# Patient Record
Sex: Female | Born: 2000 | Race: Black or African American | Hispanic: No | Marital: Single | Smoking: Never smoker
Health system: Southern US, Community
[De-identification: ages and names within clinical notes are randomized; demographics above are authoritative.]

## PROBLEM LIST (undated history)

## (undated) ENCOUNTER — Inpatient Hospital Stay: Payer: Self-pay

## (undated) ENCOUNTER — Inpatient Hospital Stay (HOSPITAL_COMMUNITY): Payer: Self-pay

## (undated) DIAGNOSIS — I10 Essential (primary) hypertension: Secondary | ICD-10-CM

## (undated) DIAGNOSIS — R1110 Vomiting, unspecified: Secondary | ICD-10-CM

## (undated) DIAGNOSIS — J45909 Unspecified asthma, uncomplicated: Secondary | ICD-10-CM

## (undated) DIAGNOSIS — N189 Chronic kidney disease, unspecified: Secondary | ICD-10-CM

## (undated) DIAGNOSIS — R197 Diarrhea, unspecified: Secondary | ICD-10-CM

## (undated) DIAGNOSIS — A549 Gonococcal infection, unspecified: Secondary | ICD-10-CM

## (undated) DIAGNOSIS — A749 Chlamydial infection, unspecified: Secondary | ICD-10-CM

## (undated) DIAGNOSIS — Z9109 Other allergy status, other than to drugs and biological substances: Secondary | ICD-10-CM

## (undated) HISTORY — DX: Vomiting, unspecified: R11.10

## (undated) HISTORY — PX: MOUTH SURGERY: SHX715

## (undated) HISTORY — PX: NO PAST SURGERIES: SHX2092

## (undated) HISTORY — DX: Diarrhea, unspecified: R19.7

---
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
## 2018-01-22 ENCOUNTER — Emergency Department (HOSPITAL_COMMUNITY): Payer: Medicaid Other

## 2018-01-22 ENCOUNTER — Emergency Department (HOSPITAL_COMMUNITY)
Admission: EM | Admit: 2018-01-22 | Discharge: 2018-01-22 | Disposition: A | Discharge status: Home / Self Care | Payer: Medicaid Other | Attending: Emergency Medicine | Admitting: Emergency Medicine

## 2018-01-22 ENCOUNTER — Other Ambulatory Visit: Payer: Self-pay

## 2018-01-22 ENCOUNTER — Encounter (HOSPITAL_COMMUNITY): Payer: Self-pay | Admitting: Emergency Medicine

## 2018-01-22 DIAGNOSIS — Z3201 Encounter for pregnancy test, result positive: Secondary | ICD-10-CM | POA: Diagnosis not present

## 2018-01-22 DIAGNOSIS — O23599 Infection of other part of genital tract in pregnancy, unspecified trimester: Secondary | ICD-10-CM | POA: Diagnosis not present

## 2018-01-22 DIAGNOSIS — B373 Candidiasis of vulva and vagina: Secondary | ICD-10-CM

## 2018-01-22 DIAGNOSIS — R1011 Right upper quadrant pain: Secondary | ICD-10-CM | POA: Insufficient documentation

## 2018-01-22 DIAGNOSIS — O9989 Other specified diseases and conditions complicating pregnancy, childbirth and the puerperium: Secondary | ICD-10-CM | POA: Insufficient documentation

## 2018-01-22 DIAGNOSIS — O2341 Unspecified infection of urinary tract in pregnancy, first trimester: Secondary | ICD-10-CM | POA: Diagnosis not present

## 2018-01-22 DIAGNOSIS — N390 Urinary tract infection, site not specified: Secondary | ICD-10-CM

## 2018-01-22 DIAGNOSIS — N898 Other specified noninflammatory disorders of vagina: Secondary | ICD-10-CM

## 2018-01-22 DIAGNOSIS — O219 Vomiting of pregnancy, unspecified: Secondary | ICD-10-CM | POA: Diagnosis not present

## 2018-01-22 DIAGNOSIS — Z3A00 Weeks of gestation of pregnancy not specified: Secondary | ICD-10-CM | POA: Insufficient documentation

## 2018-01-22 DIAGNOSIS — B3731 Acute candidiasis of vulva and vagina: Secondary | ICD-10-CM

## 2018-01-22 LAB — COMPREHENSIVE METABOLIC PANEL
ALK PHOS: 55 U/L (ref 38–126)
ALT: 13 U/L (ref 0–44)
ANION GAP: 9 (ref 5–15)
AST: 18 U/L (ref 15–41)
Albumin: 4 g/dL (ref 3.5–5.0)
BILIRUBIN TOTAL: 0.9 mg/dL (ref 0.3–1.2)
BUN: 5 mg/dL — ABNORMAL LOW (ref 6–20)
CALCIUM: 9.2 mg/dL (ref 8.9–10.3)
CO2: 24 mmol/L (ref 22–32)
Chloride: 106 mmol/L (ref 98–111)
Creatinine, Ser: 0.74 mg/dL (ref 0.44–1.00)
GFR calc Af Amer: >60 mL/min (ref >60)
GFR calc non Af Amer: >60 mL/min (ref >60)
Glucose, Bld: 93 mg/dL (ref 70–99)
Potassium: 3.6 mmol/L (ref 3.5–5.1)
Sodium: 139 mmol/L (ref 135–145)
TOTAL PROTEIN: 7.1 g/dL (ref 6.5–8.1)

## 2018-01-22 LAB — URINALYSIS, ROUTINE W REFLEX MICROSCOPIC
Bilirubin Urine: NEGATIVE
GLUCOSE, UA: NEGATIVE mg/dL
Hgb urine dipstick: NEGATIVE
KETONES UR: NEGATIVE mg/dL
NITRITE: NEGATIVE
PH: 6 (ref 5.0–8.0)
Protein, ur: NEGATIVE mg/dL
Specific Gravity, Urine: 1.025 (ref 1.005–1.030)

## 2018-01-22 LAB — WET PREP, GENITAL
Clue Cells Wet Prep HPF POC: NONE SEEN
Trich, Wet Prep: NONE SEEN

## 2018-01-22 LAB — CBC
HCT: 41.4 % (ref 36.0–46.0)
HEMOGLOBIN: 13.6 g/dL (ref 12.0–15.0)
MCH: 30.1 pg (ref 26.0–34.0)
MCHC: 32.9 g/dL (ref 30.0–36.0)
MCV: 91.6 fL (ref 78.0–100.0)
Platelets: 302 10*3/uL (ref 150–400)
RBC: 4.52 MIL/uL (ref 3.87–5.11)
RDW: 12.2 % (ref 11.5–15.5)
WBC: 16.6 10*3/uL — AB (ref 4.0–10.5)

## 2018-01-22 LAB — I-STAT BETA HCG BLOOD, ED (MC, WL, AP ONLY): I-stat hCG, quantitative: >2000 m[IU]/mL — ABNORMAL HIGH (ref <5)

## 2018-01-22 LAB — LIPASE, BLOOD: Lipase: 21 U/L (ref 11–51)

## 2018-01-22 MED ORDER — STERILE WATER FOR INJECTION IJ SOLN
INTRAMUSCULAR | Status: AC
  Administered 2018-01-22: 0.9 mL
  Filled 2018-01-22: qty 10

## 2018-01-22 MED ORDER — ONDANSETRON 4 MG PO TBDP
4.0000 mg | ORAL_TABLET | Freq: Once | ORAL | Status: AC
  Administered 2018-01-22: 4 mg via ORAL
  Filled 2018-01-22: qty 1

## 2018-01-22 MED ORDER — ONDANSETRON HCL 4 MG PO TABS: 4.0000 mg | ORAL_TABLET | Freq: Three times a day (TID) | ORAL | 0 refills | Status: DC | PRN

## 2018-01-22 MED ORDER — CEFTRIAXONE SODIUM 250 MG IJ SOLR
250.0000 mg | Freq: Once | INTRAMUSCULAR | Status: AC
  Administered 2018-01-22: 250 mg via INTRAMUSCULAR
  Filled 2018-01-22: qty 250

## 2018-01-22 MED ORDER — AZITHROMYCIN 250 MG PO TABS
1000.0000 mg | ORAL_TABLET | Freq: Once | ORAL | Status: AC
Start: 2018-01-22 — End: 2018-01-22
  Administered 2018-01-22: 1000 mg via ORAL
  Filled 2018-01-22: qty 4

## 2018-01-22 MED ORDER — CLOTRIMAZOLE 1 % VA CREA: 1.0000 | TOPICAL_CREAM | Freq: Every day | VAGINAL | 0 refills | Status: AC

## 2018-01-22 MED ORDER — CEPHALEXIN 500 MG PO CAPS: 500.0000 mg | ORAL_CAPSULE | Freq: Two times a day (BID) | ORAL | 0 refills | Status: DC

## 2018-01-22 NOTE — ED Triage Notes (Signed)
Patient adds that she "would like to be tested for everything."

## 2018-01-22 NOTE — ED Provider Notes (Signed)
Patient placed in Quick Look pathway, seen and evaluated   Chief Complaint: abdominal pain  HPI:   Patient with 4 day history of epigastric and right upper quadrant abdominal pain which is aching/throbbing. Worsens after meals. She notes persistent nausea and vomiting and has not been able to keep any food down for a few days. Denies fevers, chills, urinary symptoms, diarrhea, or constipation. Also states "I want to be tested for everything. I just started messing around with a guy and I don't trust anyone". States she does not always use protection.  ROS: positive for abdominal pain, nausea, vomiting Negative for fever, chest pain, shortness of breath, diarrhea, constipation, urinary symptoms, vaginal itching, bleeding, discharge  Physical Exam:   Gen: No distress  Neuro: Awake and Alert  Skin: Warm    Focused Exam: hypoactive bowel sounds. Tender to palpation in the epigastric and right upper quadrant regions. Murphy sign absent, Rovsing's absent, no CVA tenderness.   Initiation of care has begun. The patient has been counseled on the process, plan, and necessity for staying for the completion/evaluation, and the remainder of the medical screening examination    Bennye AlmFawze, Raymona Boss A, PA-C 01/22/18 1604    Mesner, Barbara CowerJason, MD 01/22/18 2140

## 2018-01-22 NOTE — Discharge Instructions (Signed)
Take antibiotics as prescribed.  Take the entire course, even if your symptoms improve. Use vaginal cream once a day for the next 7 days. Take Zofran as needed for nausea or vomiting. It is very important that you follow-up with OB/GYN for further management of your pregnancy. Return to the emergency room if you develop fevers, persistent vomiting despite medication, or any new or concerning symptoms.  If your symptoms are related to the pregnancy, you may follow-up at the women's hospital/[facility]U for pregnancy related concerns.

## 2018-01-22 NOTE — ED Provider Notes (Signed)
[facility] EMERGENCY DEPARTMENT Provider Note   CSN: [id number] Arrival date & time: 01/22/18  1511     History   Chief Complaint Chief Complaint  Patient presents with  . Abdominal Pain    HPI Colleen Steele is a 17 y.o. female presenting for evaluation of nausea and vomiting.  Patient states over the past 2 days, she has been having persistent nausea and vomiting.  She states this is mostly postprandial.  She reports some mild intermittent upper abdominal cramping, but no significant pain.  Cramping is worse after eating.  She denies fevers, chills, chest pain, shortness of breath, abnormal urination, abnormal bowel movements.  She states she is sexually active with one female partner, they do not use protection.  She is not on birth control.  She denies vaginal discharge, but would like to be tested for STDs and "everything."  Patient reports a history of asthma, does not take any medications daily.  She has been trying to treat her symptoms with Alka-Seltzer, although this is not helped.  Last period was beginning of May, she is normally very regular.  This is abnormal for her.  She denies vaginal bleeding.  HPI  Past Medical History:  Diagnosis Date  . Asthma   . Diarrhea   . Environmental allergies   . Vomiting     Patient Active Problem List   Diagnosis Date Noted  . Periumbilical abdominal pain 11/28/2011  . Vomiting   . Diarrhea     History reviewed. No pertinent surgical history.   OB History   None      Home Medications    Prior to Admission medications   Medication Sig Start Date End Date Taking? Authorizing Provider  albuterol (PROVENTIL HFA;VENTOLIN HFA) 108 (90 Base) MCG/ACT inhaler Inhale 1-2 puffs into the lungs every 4 (four) hours as needed for wheezing or shortness of breath.   Yes [provider]  ibuprofen (ADVIL,MOTRIN) 600 MG tablet Take 600 mg by mouth every 4 (four) hours as needed for headache.   Yes [provider]  benzonatate (TESSALON) 100 MG capsule Take 2 capsules (200 mg total) by mouth 2 (two) times daily as needed for cough. Patient not taking: Reported on 01/22/2018 09/18/17   Roxy Horseman, PA-C  cephALEXin (KEFLEX) 500 MG capsule Take 1 capsule (500 mg total) by mouth 2 (two) times daily for 5 days. 01/22/18 01/27/18  Shawndrea Rutkowski, PA-C  clotrimazole (GYNE-LOTRIMIN) 1 % vaginal cream Place 1 Applicatorful vaginally at bedtime for 7 days. 01/22/18 01/29/18  Trueman Worlds, PA-C  ondansetron (ZOFRAN) 4 MG tablet Take 1 tablet (4 mg total) by mouth every 8 (eight) hours as needed for nausea or vomiting. 01/22/18   Macgregor Aeschliman, PA-C  predniSONE (DELTASONE) 10 MG tablet Take 5 tablets (50 mg total) by mouth daily. 50mg  po qday x 4 days qs Patient not taking: Reported on 09/18/2017 04/13/14   Charlynne Pander, MD    Family History Family History  Problem Relation Age of Onset  . Diverticulitis Sister     Social History Social History   Tobacco Use  . Smoking status: Never Smoker  . Smokeless tobacco: Never Used  Substance Use Topics  . Alcohol use: No  . Drug use: No     Allergies   Patient has no known allergies.   Review of Systems Review of Systems  Gastrointestinal: Positive for abdominal pain, nausea and vomiting.  All other systems reviewed and are negative.    Physical  Exam Updated Vital Signs BP 120/80 (BP Location: Left Arm)   Pulse 77   Temp 98.6 F (37 C) (Oral)   Resp 12   LMP 11/29/2017 (Approximate) Comment: states she missed June  SpO2 100%   Physical Exam  Constitutional: She is oriented to person, place, and time. She appears well-developed and well-nourished. No distress.  Appears in no distress, laughing and joking upon me entering the room.  HENT:  Head: Normocephalic and atraumatic.  Eyes: Pupils are equal, round, and reactive to light. Conjunctivae and EOM are normal.  Neck: Normal range of motion. Neck supple.    Cardiovascular: Normal rate, regular rhythm and intact distal pulses.  Pulmonary/Chest: Effort normal and breath sounds normal. No respiratory distress. She has no wheezes.  Abdominal: Soft. She exhibits no distension and no mass. There is tenderness. There is no rebound and no guarding.  Mild epigastric discomfort without rigidity, guarding, distention.  Negative Murphy's.  No pain at McBurney's.  No rebound.  Genitourinary: Rectum normal and vagina normal. Pelvic exam was performed with patient supine. There is no rash, tenderness or lesion on the right labia. There is no rash, tenderness or lesion on the left labia. Cervix exhibits discharge. Cervix exhibits no motion tenderness. Right adnexum displays no mass, no tenderness and no fullness. Left adnexum displays no mass, no tenderness and no fullness.  Genitourinary Comments: Chaperone present.  Thick yellowish discharge with mucus noted on exam.  No CMT or adnexal tenderness.  Musculoskeletal: Normal range of motion.  Neurological: She is alert and oriented to person, place, and time.  Skin: Skin is warm and dry. Capillary refill takes less than 2 seconds.  Psychiatric: She has a normal mood and affect.  Nursing note and vitals reviewed.    ED Treatments / Results  Labs (all labs ordered are listed, but only abnormal results are displayed) Labs Reviewed  WET PREP, GENITAL - Abnormal; Notable for the following components:      Result Value   Yeast Wet Prep HPF POC PRESENT (*)    WBC, Wet Prep HPF POC MANY (*)    All other components within normal limits  COMPREHENSIVE METABOLIC PANEL - Abnormal; Notable for the following components:   BUN 5 (*)    All other components within normal limits  CBC - Abnormal; Notable for the following components:   WBC 16.6 (*)    All other components within normal limits  URINALYSIS, ROUTINE W REFLEX MICROSCOPIC - Abnormal; Notable for the following components:   APPearance HAZY (*)    Leukocytes,  UA LARGE (*)    WBC, UA >50 (*)    Bacteria, UA FEW (*)    All other components within normal limits  I-STAT BETA HCG BLOOD, ED ([facility], [facility], [facility] ONLY) - Abnormal; Notable for the following components:   I-stat hCG, quantitative >2,000.0 (*)    All other components within normal limits  LIPASE, BLOOD  HIV ANTIBODY (ROUTINE TESTING)  RPR  GC/CHLAMYDIA PROBE AMP () NOT AT [facility]    EKG None  Radiology US Abdomen Limited Ruq  Result Date: 01/22/2018 CLINICAL DATA:  Right upper quadrant pain for the past 4 days. EXAM: ULTRASOUND ABDOMEN LIMITED RIGHT UPPER QUADRANT COMPARISON:  Abdominal ultrasound dated January 04, 2012. FINDINGS: Gallbladder: No gallstones or wall thickening visualized. No sonographic Murphy sign noted by sonographer. Common bile duct: Diameter: 3 mm, normal. Liver: No focal lesion identified. Within normal limits in parenchymal echogenicity. Portal vein is patent on color Doppler imaging  with normal direction of blood flow towards the liver. IMPRESSION: Normal right upper quadrant ultrasound. Electronically Signed   By: Obie DredgeWilliam T Derry M.D.   On: 01/22/2018 18:39    Procedures Procedures (including critical care time)  Medications Ordered in ED Medications  ondansetron (ZOFRAN-ODT) disintegrating tablet 4 mg (4 mg Oral Given 01/22/18 1949)  cefTRIAXone (ROCEPHIN) injection 250 mg (250 mg Intramuscular Given 01/22/18 2010)  azithromycin (ZITHROMAX) tablet 1,000 mg (1,000 mg Oral Given 01/22/18 2009)  sterile water (preservative free) injection (0.9 mLs  Given 01/22/18 2015)     Initial Impression / Assessment and Plan / ED Course  I have reviewed the triage vital signs and the nursing notes.  Pertinent labs & imaging results that were available during my care of the patient were reviewed by me and considered in my medical decision making (see chart for details).     Patient presenting for evaluation of nausea, vomiting, and upper abdominal cramping.  Physical exam  reassuring, patient is afebrile not tachycardic.  She appears nontoxic.  Labs show positive pregnancy, mild leukocytosis at 16.  Otherwise CBC and CMP reassuring.  UA with large leuks, few bacteria, greater than 50 white cells.  As she is pregnant, will treat for UTI.  Wet prep positive for yeast.  Discussed option of prophylactic treatment for STDs with patient, patient would like treatment today.  Rocephin and azithromycin given.  Will treat yeast infection with intravaginal cream, UTI with Keflex.  Zofran given in ER with symptom medic control, patient tolerating p.o. without difficulty at this time.  Will discharge with Zofran as needed.  Patient instructed to follow-up with OB/GYN for further management of her pregnancy.  At this time, patient appears safe for discharge.  Return precautions given.  Patient states she understands and agrees to plan.   Final Clinical Impressions(s) / ED Diagnoses   Final diagnoses:  RUQ pain  Nausea/vomiting in pregnancy  Yeast infection of the vagina  Vaginal discharge  Urinary tract infection without hematuria, site unspecified    ED Discharge Orders        Ordered    clotrimazole (GYNE-LOTRIMIN) 1 % vaginal cream  Daily at bedtime     01/22/18 2049    cephALEXin (KEFLEX) 500 MG capsule  2 times daily     01/22/18 2049    ondansetron (ZOFRAN) 4 MG tablet  Every 8 hours PRN     01/22/18 2049       Alveria ApleyCaccavale, Aloni Chuang, PA-C 01/22/18 2051    Bethann BerkshireZammit, Joseph, MD 01/25/18 [id number]

## 2018-01-22 NOTE — ED Triage Notes (Signed)
Patient to ED c/o abd pain, N/V x 3 days. States she missed her period last month, LMP beginning of May. Denies urinary symptoms, diarrhea, fevers/chills.

## 2018-01-23 ENCOUNTER — Ambulatory Visit (INDEPENDENT_AMBULATORY_CARE_PROVIDER_SITE_OTHER): Payer: Medicaid Other

## 2018-01-23 ENCOUNTER — Encounter: Payer: Self-pay | Admitting: Family

## 2018-01-23 ENCOUNTER — Other Ambulatory Visit: Payer: Self-pay

## 2018-01-23 VITALS — Wt 181.0 lb

## 2018-01-23 DIAGNOSIS — N912 Amenorrhea, unspecified: Secondary | ICD-10-CM | POA: Diagnosis present

## 2018-01-23 DIAGNOSIS — Z3201 Encounter for pregnancy test, result positive: Secondary | ICD-10-CM

## 2018-01-23 LAB — RPR: RPR: NONREACTIVE

## 2018-01-23 LAB — HIV ANTIBODY (ROUTINE TESTING W REFLEX): HIV SCREEN 4TH GENERATION: NONREACTIVE

## 2018-01-23 LAB — GC/CHLAMYDIA PROBE AMP (~~LOC~~) NOT AT ARMC
Chlamydia: POSITIVE — AB
Neisseria Gonorrhea: POSITIVE — AB

## 2018-01-23 LAB — POCT PREGNANCY, URINE: Preg Test, Ur: POSITIVE — AB

## 2018-01-23 NOTE — Progress Notes (Signed)
Patient here for pregnancy test.  Test is positive.  LMP 11/28/2017 EDD 09/07/2018.  Advised patient to seek prenatal care and start taking prenatal vitamins.

## 2018-01-25 ENCOUNTER — Other Ambulatory Visit: Payer: Self-pay

## 2018-01-25 ENCOUNTER — Encounter (HOSPITAL_COMMUNITY): Payer: Self-pay | Admitting: Emergency Medicine

## 2018-01-25 ENCOUNTER — Emergency Department (HOSPITAL_COMMUNITY)
Admission: EM | Admit: 2018-01-25 | Discharge: 2018-01-25 | Disposition: A | Discharge status: Home / Self Care | Payer: Medicaid Other | Attending: Emergency Medicine | Admitting: Emergency Medicine

## 2018-01-25 DIAGNOSIS — O98311 Other infections with a predominantly sexual mode of transmission complicating pregnancy, first trimester: Secondary | ICD-10-CM | POA: Diagnosis not present

## 2018-01-25 DIAGNOSIS — A64 Unspecified sexually transmitted disease: Secondary | ICD-10-CM

## 2018-01-25 DIAGNOSIS — O99511 Diseases of the respiratory system complicating pregnancy, first trimester: Secondary | ICD-10-CM | POA: Diagnosis not present

## 2018-01-25 DIAGNOSIS — A5611 Chlamydial female pelvic inflammatory disease: Secondary | ICD-10-CM | POA: Diagnosis not present

## 2018-01-25 DIAGNOSIS — J45909 Unspecified asthma, uncomplicated: Secondary | ICD-10-CM | POA: Insufficient documentation

## 2018-01-25 DIAGNOSIS — O219 Vomiting of pregnancy, unspecified: Secondary | ICD-10-CM | POA: Insufficient documentation

## 2018-01-25 DIAGNOSIS — Z3A08 8 weeks gestation of pregnancy: Secondary | ICD-10-CM | POA: Diagnosis not present

## 2018-01-25 MED ORDER — AZITHROMYCIN 250 MG PO TABS
1000.0000 mg | ORAL_TABLET | Freq: Once | ORAL | Status: AC
  Administered 2018-01-25: 1000 mg via ORAL
  Filled 2018-01-25: qty 4

## 2018-01-25 MED ORDER — ONDANSETRON 4 MG PO TBDP
8.0000 mg | ORAL_TABLET | Freq: Once | ORAL | Status: AC
  Administered 2018-01-25: 8 mg via ORAL
  Filled 2018-01-25: qty 2

## 2018-01-25 NOTE — ED Triage Notes (Signed)
Pt states she is [redacted] weeks pregnant, was treated here for STD recently and when checking mychart saw that her results were positive. Is concerned because she states she vomited up the po antibiotics when she took them here and wants to be sure she doesn't need more treatment.

## 2018-01-25 NOTE — ED Provider Notes (Signed)
[facility] EMERGENCY DEPARTMENT Provider Note   CSN: [id number] Arrival date & time: 01/25/18  2106     History   Chief Complaint Chief Complaint  Patient presents with  . Exposure to STD    HPI Colleen Steele is a 17 y.o. female.  17 year old female with history of asthma who presents for STD treatment.  The patient was evaluated here 3 days ago and given antibiotics for empiric treatment of STD.  She notes that she is [redacted] weeks pregnant.  She checked her chart online today and noted that her STD results were positive.  She became concerned because she vomited up 1 of the medications immediately after she received it in the ED 3 days ago.  She is here to make sure that she does not need treatment again.  She was given antibiotics for UTI and states that she has been compliant.  She denies any severe vomiting today, no fevers, no abdominal pain or other complaints.  The history is provided by the patient.  Exposure to STD     Past Medical History:  Diagnosis Date  . Asthma   . Diarrhea   . Environmental allergies   . Vomiting     Patient Active Problem List   Diagnosis Date Noted  . Periumbilical abdominal pain 11/28/2011  . Vomiting   . Diarrhea     History reviewed. No pertinent surgical history.   OB History   None      Home Medications    Prior to Admission medications   Medication Sig Start Date End Date Taking? Authorizing Provider  albuterol (PROVENTIL HFA;VENTOLIN HFA) 108 (90 Base) MCG/ACT inhaler Inhale 1-2 puffs into the lungs every 4 (four) hours as needed for wheezing or shortness of breath.    [provider]  clotrimazole (GYNE-LOTRIMIN) 1 % vaginal cream Place 1 Applicatorful vaginally at bedtime for 7 days. 01/22/18 01/29/18  Caccavale, Sophia, PA-C  ibuprofen (ADVIL,MOTRIN) 600 MG tablet Take 600 mg by mouth every 4 (four) hours as needed for headache.    [provider]  ondansetron (ZOFRAN) 4 MG tablet Take 1  tablet (4 mg total) by mouth every 8 (eight) hours as needed for nausea or vomiting. 01/22/18   Caccavale, Sophia, PA-C    Family History Family History  Problem Relation Age of Onset  . Diverticulitis Sister     Social History Social History   Tobacco Use  . Smoking status: Never Smoker  . Smokeless tobacco: Never Used  Substance Use Topics  . Alcohol use: No  . Drug use: No     Allergies   Patient has no known allergies.   Review of Systems Review of Systems All other systems reviewed and are negative except that which was mentioned in HPI   Physical Exam Updated Vital Signs BP (!) 131/110 (BP Location: Right Arm)   Pulse 91   Temp 98.6 F (37 C) (Oral)   Resp 20   SpO2 100%   Physical Exam  Constitutional: She is oriented to person, place, and time. She appears well-developed and well-nourished. No distress.  HENT:  Head: Normocephalic and atraumatic.  Mouth/Throat: Oropharynx is clear and moist.  Moist mucous membranes  Eyes: Conjunctivae are normal.  Neck: Neck supple.  Cardiovascular: Normal rate, regular rhythm and normal heart sounds.  No murmur heard. Pulmonary/Chest: Effort normal and breath sounds normal.  Abdominal: Soft. Bowel sounds are normal. She exhibits no distension. There is no tenderness.  Musculoskeletal: She exhibits no edema.  Neurological: She is alert and oriented to person, place, and time.  Fluent speech  Skin: Skin is warm and dry.  Psychiatric: She has a normal mood and affect. Judgment normal.  Nursing note and vitals reviewed.    ED Treatments / Results  Labs (all labs ordered are listed, but only abnormal results are displayed) Labs Reviewed - No data to display  EKG None  Radiology No results found.  Procedures Procedures (including critical care time)  Medications Ordered in ED Medications  ondansetron (ZOFRAN-ODT) disintegrating tablet 8 mg (8 mg Oral Given 01/25/18 2138)  azithromycin (ZITHROMAX) tablet  1,000 mg (1,000 mg Oral Given 01/25/18 2138)     Initial Impression / Assessment and Plan / ED Course  I have reviewed the triage vital signs and the nursing notes.     She was well-appearing and comfortable on exam.  I reviewed labs from a few days ago which show positive for GC and chlamydia.  She states that she immediately vomited the azithromycin that she had received.  Gave her a repeat dose of azithromycin here with Zofran.  Instructed to continue prescriptions at home and schedule an appointment with OB/GYN for follow-up.  Extensively reviewed return precautions and she voiced understanding. Final Clinical Impressions(s) / ED Diagnoses   Final diagnoses:  STD (female)    ED Discharge Orders    None       Little, Ambrose Finlandachel Morgan, MD 01/25/18 2149

## 2018-01-25 NOTE — ED Notes (Signed)
Patient Alert and oriented to baseline. Stable and ambulatory to baseline. Patient verbalized understanding of the discharge instructions.  Patient belongings were taken by the patient.   

## 2018-01-25 NOTE — Discharge Instructions (Addendum)
KEEP TAKING PRESCRIPTION MEDICINES AS PRESCRIBED UNTIL ANTIBIOTICS ARE FINISHED. FOLLOW UP WITH OBGYN. RETURN TO ER IF ANY FEVERS, ABDOMINAL PAIN, VAGINAL BLEEDING, OR SEVERE VOMITING.

## 2018-02-18 ENCOUNTER — Encounter (HOSPITAL_COMMUNITY): Payer: Self-pay | Admitting: Emergency Medicine

## 2018-02-18 ENCOUNTER — Emergency Department (HOSPITAL_COMMUNITY)
Admission: EM | Admit: 2018-02-18 | Discharge: 2018-02-19 | Disposition: A | Discharge status: Home / Self Care | Payer: Medicaid Other | Attending: Emergency Medicine | Admitting: Emergency Medicine

## 2018-02-18 ENCOUNTER — Other Ambulatory Visit: Payer: Self-pay

## 2018-02-18 DIAGNOSIS — J45909 Unspecified asthma, uncomplicated: Secondary | ICD-10-CM | POA: Insufficient documentation

## 2018-02-18 DIAGNOSIS — E860 Dehydration: Secondary | ICD-10-CM | POA: Diagnosis not present

## 2018-02-18 DIAGNOSIS — N390 Urinary tract infection, site not specified: Secondary | ICD-10-CM | POA: Insufficient documentation

## 2018-02-18 DIAGNOSIS — Z3A12 12 weeks gestation of pregnancy: Secondary | ICD-10-CM | POA: Insufficient documentation

## 2018-02-18 DIAGNOSIS — Z79899 Other long term (current) drug therapy: Secondary | ICD-10-CM | POA: Insufficient documentation

## 2018-02-18 DIAGNOSIS — R8281 Pyuria: Secondary | ICD-10-CM

## 2018-02-18 DIAGNOSIS — O219 Vomiting of pregnancy, unspecified: Secondary | ICD-10-CM | POA: Diagnosis present

## 2018-02-18 LAB — CBC WITH DIFFERENTIAL/PLATELET
ABS IMMATURE GRANULOCYTES: 0 10*3/uL (ref 0.0–0.1)
Basophils Absolute: 0 10*3/uL (ref 0.0–0.1)
Basophils Relative: 0 %
Eosinophils Absolute: 0 10*3/uL (ref 0.0–0.7)
Eosinophils Relative: 0 %
HCT: 44.1 % (ref 36.0–46.0)
HEMOGLOBIN: 14.8 g/dL (ref 12.0–15.0)
Immature Granulocytes: 0 %
Lymphocytes Relative: 11 %
Lymphs Abs: 1.7 10*3/uL (ref 0.7–4.0)
MCH: 30.2 pg (ref 26.0–34.0)
MCHC: 33.6 g/dL (ref 30.0–36.0)
MCV: 90 fL (ref 78.0–100.0)
MONOS PCT: 7 %
Monocytes Absolute: 1.1 10*3/uL — ABNORMAL HIGH (ref 0.1–1.0)
Neutro Abs: 12.3 10*3/uL — ABNORMAL HIGH (ref 1.7–7.7)
Neutrophils Relative %: 82 %
PLATELETS: 313 10*3/uL (ref 150–400)
RBC: 4.9 MIL/uL (ref 3.87–5.11)
RDW: 12.1 % (ref 11.5–15.5)
WBC: 15.1 10*3/uL — ABNORMAL HIGH (ref 4.0–10.5)

## 2018-02-18 LAB — BASIC METABOLIC PANEL
ANION GAP: 11 (ref 5–15)
BUN: 7 mg/dL (ref 6–20)
CALCIUM: 9.6 mg/dL (ref 8.9–10.3)
CO2: 23 mmol/L (ref 22–32)
Chloride: 104 mmol/L (ref 98–111)
Creatinine, Ser: 0.77 mg/dL (ref 0.44–1.00)
GFR calc Af Amer: >60 mL/min (ref >60)
GLUCOSE: 87 mg/dL (ref 70–99)
Potassium: 3.6 mmol/L (ref 3.5–5.1)
Sodium: 138 mmol/L (ref 135–145)

## 2018-02-18 MED ORDER — ONDANSETRON HCL 4 MG PO TABS: 4.0000 mg | ORAL_TABLET | Freq: Three times a day (TID) | ORAL | 0 refills | Status: DC | PRN

## 2018-02-18 MED ORDER — SODIUM CHLORIDE 0.9 % IV BOLUS
1000.0000 mL | Freq: Once | INTRAVENOUS | Status: AC
  Administered 2018-02-18: 1000 mL via INTRAVENOUS

## 2018-02-18 MED ORDER — ONDANSETRON HCL 4 MG/2ML IJ SOLN
4.0000 mg | Freq: Once | INTRAMUSCULAR | Status: AC
  Administered 2018-02-18: 4 mg via INTRAVENOUS
  Filled 2018-02-18: qty 2

## 2018-02-18 NOTE — ED Notes (Signed)
Attempted fetal heart tones; unable to fully assess.

## 2018-02-18 NOTE — ED Triage Notes (Signed)
Pt states she is approx [redacted] weeks pregnant.  Reports nausea and vomiting since being seen in ED last.  States she ran out of Zofran that she was given in ED and her PCP gave her Unisom but it doesn't work.  Also reports dizziness for past 2 days and hasn't ate in 2 days.

## 2018-02-18 NOTE — ED Provider Notes (Signed)
[facility] EMERGENCY DEPARTMENT Provider Note   CSN: [id number] Arrival date & time: 02/18/18  2058    History   Chief Complaint Chief Complaint  Patient presents with  . Nausea  . Dizziness    HPI Colleen Steele is a 17 y.o. female.  17 y/o G1P0 female, approximately [redacted] weeks pregnant, presents to the emergency department for evaluation of nausea and vomiting.  She has had worsening nausea and vomiting over the past 4 days.  Symptoms associated with lightheadedness during episodes of emesis.  No associated syncope.  She previously had symptomatic improvement with Zofran.  She ran out of this prescription a few weeks ago and was told to take Unisom, but is unable to tolerate it without vomiting.  She denies any fevers, abdominal pain, urinary symptoms, vaginal bleeding, vaginal discharge.  She has had ongoing issues with hyperemesis during her first trimester of pregnancy.  The history is provided by the patient. No language interpreter was used.  Dizziness    Past Medical History:  Diagnosis Date  . Asthma   . Diarrhea   . Environmental allergies   . Vomiting     Patient Active Problem List   Diagnosis Date Noted  . Periumbilical abdominal pain 11/28/2011  . Vomiting   . Diarrhea     History reviewed. No pertinent surgical history.   OB History    Gravida  1   Para      Term      Preterm      AB      Living        SAB      TAB      Ectopic      Multiple      Live Births               Home Medications    Prior to Admission medications   Medication Sig Start Date End Date Taking? Authorizing Provider  albuterol (PROVENTIL HFA;VENTOLIN HFA) 108 (90 Base) MCG/ACT inhaler Inhale 1-2 puffs into the lungs every 4 (four) hours as needed for wheezing or shortness of breath.   Yes [provider]  doxylamine, Sleep, (UNISOM) 25 MG tablet Take 25 mg by mouth at bedtime as needed for sleep.   Yes [provider]   ibuprofen (ADVIL,MOTRIN) 600 MG tablet Take 600 mg by mouth every 4 (four) hours as needed for headache.   Yes [provider]  Prenatal Vit-Fe Fumarate-FA (MULTIVITAMIN-PRENATAL) 27-0.8 MG TABS tablet Take 1 tablet by mouth daily at 12 noon.   Yes [provider]  ondansetron (ZOFRAN) 4 MG tablet Take 1 tablet (4 mg total) by mouth every 8 (eight) hours as needed for nausea or vomiting. 02/18/18   Antony Madura, PA-C    Family History Family History  Problem Relation Age of Onset  . Diverticulitis Sister     Social History Social History   Tobacco Use  . Smoking status: Never Smoker  . Smokeless tobacco: Never Used  Substance Use Topics  . Alcohol use: No  . Drug use: No     Allergies   Patient has no known allergies.   Review of Systems Review of Systems  Neurological: Positive for dizziness.  Ten systems reviewed and are negative for acute change, except as noted in the HPI.    Physical Exam Updated Vital Signs BP 130/83 (BP Location: Left Arm)   Pulse 80   Temp 98.1 F (36.7 C) (Oral)   Resp 19  LMP 11/29/2017 (Approximate) Comment: states she missed June  SpO2 100%   Physical Exam  Constitutional: She is oriented to person, place, and time. She appears well-developed and well-nourished. No distress.  Nontoxic appearing and in NAD  HENT:  Head: Normocephalic and atraumatic.  Eyes: Conjunctivae and EOM are normal. No scleral icterus.  Neck: Normal range of motion.  Cardiovascular: Normal rate, regular rhythm and intact distal pulses.  Pulmonary/Chest: Effort normal. No respiratory distress.  Abdominal: Soft. There is no tenderness.  Soft, nontender abdomen. No peritoneal signs.  Musculoskeletal: Normal range of motion.  Neurological: She is alert and oriented to person, place, and time. She exhibits normal muscle tone. Coordination normal.  Skin: Skin is warm and dry. No rash noted. She is not diaphoretic. No erythema. No pallor.    Psychiatric: She has a normal mood and affect. Her behavior is normal.  Nursing note and vitals reviewed.    ED Treatments / Results  Labs (all labs ordered are listed, but only abnormal results are displayed) Labs Reviewed  CBC WITH DIFFERENTIAL/PLATELET - Abnormal; Notable for the following components:      Result Value   WBC 15.1 (*)    Neutro Abs 12.3 (*)    Monocytes Absolute 1.1 (*)    All other components within normal limits  URINALYSIS, ROUTINE W REFLEX MICROSCOPIC - Abnormal; Notable for the following components:   Color, Urine AMBER (*)    APPearance HAZY (*)    Glucose, UA 50 (*)    Ketones, ur 80 (*)    Leukocytes, UA MODERATE (*)    WBC, UA >50 (*)    Bacteria, UA RARE (*)    All other components within normal limits  BASIC METABOLIC PANEL    EKG None  Radiology No results found.    EMERGENCY DEPARTMENT [place] PREGNANCY "Study: Limited Ultrasound of the Pelvis for Pregnancy"  INDICATIONS:Pregnancy(required) and nausea and vomiting Multiple views of the uterus and pelvic cavity were obtained in real-time with a multi-frequency probe.  APPROACH:Transabdominal  PERFORMED BY: Myself IMAGES ARCHIVED?: Yes LIMITATIONS: Body habitus and Emergent procedure PREGNANCY FREE FLUID: None ADNEXAL FINDINGS: not assessed GESTATIONAL AGE, ESTIMATE: 12 weeks FETAL HEART RATE: WNL INTERPRETATION: Fetal heart activity seen   Procedures Procedures (including critical care time)  Medications Ordered in ED Medications  sodium chloride 0.9 % bolus 1,000 mL (0 mLs Intravenous Stopped 02/18/18 2322)  ondansetron (ZOFRAN) injection 4 mg (4 mg Intravenous Given 02/18/18 2207)  sodium chloride 0.9 % bolus 1,000 mL (0 mLs Intravenous Stopped 02/19/18 0024)    6:30 AM Notified patient by phone that her urine was concerning for UTI.  Verbalized that prescription will be called into her pharmacy for pickup.  She requests that the prescription be sent to [facility] on [address].  Message was left with [facility] regarding new prescription.   Initial Impression / Assessment and Plan / ED Course  I have reviewed the triage vital signs and the nursing notes.  Pertinent labs & imaging results that were available during my care of the patient were reviewed by me and considered in my medical decision making (see chart for details).     17 year old female presenting for hyperemesis in pregnancy.  This was previously well controlled with Zofran, but she has been out of this prescription for a few weeks.  She has tried taking Unisom without relief of her nausea.  She reports inability to keep down fluids for the past 2 days.  Patient has felt improved after  receiving 2 L IV fluids.  She is able to tolerate ginger ale without additional vomiting.  Laboratory work-up notable for leukocytosis, though this may be secondary to the stress of frequent emesis as well as dehydration.  It is also not uncommon for leukocytosis to be present in the setting of pregnancy.  No electrolyte derangements noted.  Kidney function preserved.  She does have evidence of pyuria concerning for urinary tract infection.  Will start on Keflex.  The patient has been advised to follow-up with her OB/GYN.  Her next appointment is scheduled for August 8.  Return precautions discussed and provided. Patient discharged in stable condition with no unaddressed concerns.   Final Clinical Impressions(s) / ED Diagnoses   Final diagnoses:  Dehydration  Pyuria    ED Discharge Orders        Ordered    ondansetron (ZOFRAN) 4 MG tablet  Every 8 hours PRN     02/18/18 2354       Antony MaduraHumes, Marene Gilliam, PA-C 02/19/18 [id number]    Mesner, Barbara CowerJason, MD 02/21/18 [phone]

## 2018-02-18 NOTE — ED Notes (Signed)
Patient given saltine crackers and PO fluids. Tolerating well at this time.

## 2018-02-19 LAB — URINALYSIS, ROUTINE W REFLEX MICROSCOPIC
BILIRUBIN URINE: NEGATIVE
Glucose, UA: 50 mg/dL — AB
Hgb urine dipstick: NEGATIVE
KETONES UR: 80 mg/dL — AB
Nitrite: NEGATIVE
PROTEIN: NEGATIVE mg/dL
Specific Gravity, Urine: 1.026 (ref 1.005–1.030)
WBC, UA: >50 WBC/hpf — ABNORMAL HIGH (ref 0–5)
pH: 5 (ref 5.0–8.0)

## 2018-02-19 NOTE — ED Notes (Signed)
Patient requests to be discharged. States she does not want to wait for UA results.   PA Tresa EndoKelly made aware. States if patient's phone number is correct in chart she can be d/c home.

## 2018-02-19 NOTE — ED Notes (Addendum)
Patient verbalizes understanding of medications and discharge instructions. No further questions at this time. VSS and patient ambulatory at discharge.   

## 2018-02-19 NOTE — ED Notes (Signed)
Patient verbalizes understanding of medications and discharge instructions. No further questions at this time. VSS and patient ambulatory at discharge.   

## 2018-03-01 ENCOUNTER — Other Ambulatory Visit: Payer: Self-pay

## 2018-03-01 ENCOUNTER — Encounter: Payer: Self-pay | Admitting: General Practice

## 2018-03-01 ENCOUNTER — Encounter: Payer: Self-pay | Admitting: Obstetrics and Gynecology

## 2018-03-01 ENCOUNTER — Ambulatory Visit (INDEPENDENT_AMBULATORY_CARE_PROVIDER_SITE_OTHER): Payer: Medicaid Other | Admitting: Obstetrics and Gynecology

## 2018-03-01 ENCOUNTER — Other Ambulatory Visit (HOSPITAL_COMMUNITY)
Admission: RE | Admit: 2018-03-01 | Discharge: 2018-03-01 | Disposition: A | Discharge status: Home / Self Care | Payer: Medicaid Other | Source: Ambulatory Visit | Attending: Obstetrics and Gynecology | Admitting: Obstetrics and Gynecology

## 2018-03-01 DIAGNOSIS — Z3481 Encounter for supervision of other normal pregnancy, first trimester: Secondary | ICD-10-CM

## 2018-03-01 DIAGNOSIS — O0993 Supervision of high risk pregnancy, unspecified, third trimester: Secondary | ICD-10-CM | POA: Insufficient documentation

## 2018-03-01 DIAGNOSIS — O219 Vomiting of pregnancy, unspecified: Secondary | ICD-10-CM | POA: Diagnosis not present

## 2018-03-01 DIAGNOSIS — Z3A13 13 weeks gestation of pregnancy: Secondary | ICD-10-CM | POA: Insufficient documentation

## 2018-03-01 DIAGNOSIS — Z3400 Encounter for supervision of normal first pregnancy, unspecified trimester: Secondary | ICD-10-CM

## 2018-03-01 LAB — POCT URINALYSIS DIPSTICK OB
Glucose, UA: NEGATIVE — AB
Nitrite, UA: NEGATIVE
ODOR: NEGATIVE
PH UA: 6 (ref 5.0–8.0)
RBC UA: NEGATIVE
Spec Grav, UA: >=1.03 — AB (ref 1.010–1.025)
UROBILINOGEN UA: 1 U/dL

## 2018-03-01 MED ORDER — ONDANSETRON HCL 4 MG PO TABS: 4.0000 mg | ORAL_TABLET | Freq: Three times a day (TID) | ORAL | 0 refills | Status: DC | PRN

## 2018-03-01 MED ORDER — PRENATAL GUMMIES/DHA & FA 0.4-32.5 MG PO CHEW: 1.0000 | CHEWABLE_TABLET | Freq: Every day | ORAL | 12 refills | Status: DC

## 2018-03-01 NOTE — Patient Instructions (Addendum)
Second Trimester of Pregnancy The second trimester is from week 13 through week 28, month 4 through 6. This is often the time in pregnancy that you feel your best. Often times, morning sickness has lessened or quit. You may have more energy, and you may get hungry more often. Your unborn baby (fetus) is growing rapidly. At the end of the sixth month, he or she is about 9 inches long and weighs about 1 pounds. You will likely feel the baby move (quickening) between 18 and 20 weeks of pregnancy. Follow these instructions at home:  Avoid all smoking, herbs, and alcohol. Avoid drugs not approved by your doctor.  Do not use any tobacco products, including cigarettes, chewing tobacco, and electronic cigarettes. If you need help quitting, ask your doctor. You may get counseling or other support to help you quit.  Only take medicine as told by your doctor. Some medicines are safe and some are not during pregnancy.  Exercise only as told by your doctor. Stop exercising if you start having cramps.  Eat regular, healthy meals.  Wear a good support bra if your breasts are tender.  Do not use hot tubs, steam rooms, or saunas.  Wear your seat belt when driving.  Avoid raw meat, uncooked cheese, and liter boxes and soil used by cats.  Take your prenatal vitamins.  Take 1500-2000 milligrams of calcium daily starting at the 20th week of pregnancy until you deliver your baby.  Try taking medicine that helps you poop (stool softener) as needed, and if your doctor approves. Eat more fiber by eating fresh fruit, vegetables, and whole grains. Drink enough fluids to keep your pee (urine) clear or pale yellow.  Take warm water baths (sitz baths) to soothe pain or discomfort caused by hemorrhoids. Use hemorrhoid cream if your doctor approves.  If you have puffy, bulging veins (varicose veins), wear support hose. Raise (elevate) your feet for 15 minutes, 3-4 times a day. Limit salt in your diet.  Avoid heavy  lifting, wear low heals, and sit up straight.  Rest with your legs raised if you have leg cramps or low back pain.  Visit your dentist if you have not gone during your pregnancy. Use a soft toothbrush to brush your teeth. Be gentle when you floss.  You can have sex (intercourse) unless your doctor tells you not to.  Go to your doctor visits. Get help if:  You feel dizzy.  You have mild cramps or pressure in your lower belly (abdomen).  You have a nagging pain in your belly area.  You continue to feel sick to your stomach (nauseous), throw up (vomit), or have watery poop (diarrhea).  You have bad smelling fluid coming from your vagina.  You have pain with peeing (urination). Get help right away if:  You have a fever.  You are leaking fluid from your vagina.  You have spotting or bleeding from your vagina.  You have severe belly cramping or pain.  You lose or gain weight rapidly.  You have trouble catching your breath and have chest pain.  You notice sudden or extreme puffiness (swelling) of your face, hands, ankles, feet, or legs.  You have not felt the baby move in over an hour.  You have severe headaches that do not go away with medicine.  You have vision changes. This information is not intended to replace advice given to you by your health care provider. Make sure you discuss any questions you have with your health care   provider. Document Released: 10/05/2009 Document Revised: 12/17/2015 Document Reviewed: 09/11/2012 Elsevier Interactive Patient Education  2017 [employer]  Morning Sickness Morning sickness is when you feel sick to your stomach (nauseous) during pregnancy. This nauseous feeling may or may not come with vomiting. It often occurs in the morning but can be a problem any time of day. Morning sickness is most common during the first trimester, but it may continue throughout pregnancy. While morning sickness is unpleasant, it is usually harmless unless  you develop severe and continual vomiting (hyperemesis gravidarum). This condition requires more intense treatment. What are the causes? The cause of morning sickness is not completely known but seems to be related to normal hormonal changes that occur in pregnancy. What increases the risk? You are at greater risk if you:  Experienced nausea or vomiting before your pregnancy.  Had morning sickness during a previous pregnancy.  Are pregnant with more than one baby, such as twins.  How is this treated? Do not use any medicines (prescription, over-the-counter, or herbal) for morning sickness without first talking to your health care provider. Your health care provider may prescribe or recommend:  Vitamin B6 supplements.  Anti-nausea medicines.  The herbal medicine ginger.  Follow these instructions at home:  Only take over-the-counter or prescription medicines as directed by your health care provider.  Taking multivitamins before getting pregnant can prevent or decrease the severity of morning sickness in most women.  Eat a piece of dry toast or unsalted crackers before getting out of bed in the morning.  Eat five or six small meals a day.  Eat dry and bland foods (rice, baked potato). Foods high in carbohydrates are often helpful.  Do not drink liquids with your meals. Drink liquids between meals.  Avoid greasy, fatty, and spicy foods.  Get someone to cook for you if the smell of any food causes nausea and vomiting.  If you feel nauseous after taking prenatal vitamins, take the vitamins at night or with a snack.  Snack on protein foods (nuts, yogurt, cheese) between meals if you are hungry.  Eat unsweetened gelatins for desserts.  Wearing an acupressure wristband (worn for sea sickness) may be helpful.  Acupuncture may be helpful.  Do not smoke.  Get a humidifier to keep the air in your house free of odors.  Get plenty of fresh air. Contact a health care provider  if:  Your home remedies are not working, and you need medicine.  You feel dizzy or lightheaded.  You are losing weight. Get help right away if:  You have persistent and uncontrolled nausea and vomiting.  You pass out (faint). This information is not intended to replace advice given to you by your health care provider. Make sure you discuss any questions you have with your health care provider. Document Released: 09/01/2006 Document Revised: 12/17/2015 Document Reviewed: 12/26/2012 Elsevier Interactive Patient Education  2017 Elsevier Inc.  

## 2018-03-01 NOTE — Progress Notes (Signed)
  Subjective:    Colleen Steele is being seen today for her first obstetrical visit.  This is a planned pregnancy. She is at 391w1d gestation. Her obstetrical history is significant for teen pregnancy, h/o MJ use and h/o miscarriage. Relationship with FOB (Colleen Steele): significant other, not living together. Patient does intend to breast feed. Pregnancy history fully reviewed.  Patient reports nausea and vomiting.  Review of Systems:   Review of Systems  Constitutional: Negative.   HENT: Negative.   Eyes: Negative.   Respiratory: Negative.   Cardiovascular: Negative.   Gastrointestinal: Positive for nausea and vomiting (well-managed with Zofran).  Endocrine: Negative.   Genitourinary: Negative.   Musculoskeletal: Negative.   Skin: Negative.   Allergic/Immunologic: Negative.   Neurological: Negative.   Hematological: Negative.   Psychiatric/Behavioral: Negative.     Objective:     BP (!) 141/84   Pulse 91   Temp 98.2 F (36.8 C)   Wt 165 lb (74.8 kg)   LMP 11/29/2017 (Approximate) Comment: states she missed June Physical Exam  Nursing note and vitals reviewed. Constitutional: She is oriented to person, place, and time. She appears well-developed and well-nourished.  HENT:  Head: Normocephalic and atraumatic.  Right Ear: External ear normal.  Left Ear: External ear normal.  Nose: Nose normal.  Mouth/Throat: Oropharynx is clear and moist.  Eyes: Pupils are equal, round, and reactive to light. Conjunctivae and EOM are normal.  Neck: Normal range of motion. Neck supple.  Cardiovascular: Normal rate, regular rhythm, normal heart sounds and intact distal pulses.  Respiratory: Effort normal and breath sounds normal.  GI: Soft. Bowel sounds are normal.  Genitourinary: Vagina normal and uterus normal.  Genitourinary Comments: Uterus: enlarged, S=D, SE: cervix is smooth, pink, no lesions, small amt of thick, white vaginal d/c -- WP, GC/CT done, closed/long/firm, no CMT or  friability, no adnexal tenderness   Musculoskeletal: Normal range of motion.  Neurological: She is alert and oriented to person, place, and time. She has normal reflexes.  Skin: Skin is warm and dry.  Psychiatric: She has a normal mood and affect. Her behavior is normal. Judgment and thought content normal.    Maternal Exam:  Abdomen: Patient reports no abdominal tenderness. Introitus: Normal vulva. Normal vagina.  Ferning test: not done.  Nitrazine test: not done. Amniotic fluid character: not assessed.  Pelvis: adequate for delivery.   Cervix: Cervix evaluated by sterile speculum exam and digital exam.     Fetal Exam Fetal Monitor Review: Mode: hand-held doppler probe.   Baseline rate: 145 bpm.      Assessment:    Pregnancy: G2P0010 Patient Active Problem List   Diagnosis Date Noted  . Supervision of normal first pregnancy, antepartum 03/01/2018  . Vomiting        Plan:     Initial labs drawn. Panorama drawn. Prenatal vitamins. Zofran and PNV Rx's renewed. Problem list reviewed and updated. AFP3 discussed: requested. Role of ultrasound in pregnancy discussed; fetal survey: ordered. Amniocentesis discussed: not indicated. Follow up in 4 weeks. 100% of 40 min visit spent on counseling and coordination of care.    Raelyn Moraolitta Mailin Coglianese, MSN, CNM 03/01/2018

## 2018-03-01 NOTE — Addendum Note (Signed)
Addended by: Areta HaberMOREHEAD, Ladaisha Portillo B on: 03/01/2018 04:47 PM   Modules accepted: Orders

## 2018-03-02 ENCOUNTER — Encounter: Payer: Self-pay | Admitting: General Practice

## 2018-03-02 LAB — CERVICOVAGINAL ANCILLARY ONLY
Chlamydia: NEGATIVE
Neisseria Gonorrhea: NEGATIVE

## 2018-03-04 LAB — CULTURE, OB URINE

## 2018-03-04 LAB — URINE CULTURE, OB REFLEX

## 2018-03-09 LAB — SMN1 COPY NUMBER ANALYSIS (SMA CARRIER SCREENING)

## 2018-03-12 ENCOUNTER — Encounter: Payer: Self-pay | Admitting: General Practice

## 2018-03-12 LAB — OBSTETRIC PANEL, INCLUDING HIV
ANTIBODY SCREEN: NEGATIVE
Basophils Absolute: 0 10*3/uL (ref 0.0–0.2)
Basos: 0 %
EOS (ABSOLUTE): 0 10*3/uL (ref 0.0–0.4)
EOS: 0 %
HEMOGLOBIN: 13.8 g/dL (ref 11.1–15.9)
HIV Screen 4th Generation wRfx: NONREACTIVE
Hematocrit: 42.3 % (ref 34.0–46.6)
Hepatitis B Surface Ag: NEGATIVE
IMMATURE GRANULOCYTES: 0 %
Immature Grans (Abs): 0 10*3/uL (ref 0.0–0.1)
LYMPHS ABS: 2 10*3/uL (ref 0.7–3.1)
Lymphs: 14 %
MCH: 30.1 pg (ref 26.6–33.0)
MCHC: 32.6 g/dL (ref 31.5–35.7)
MCV: 92 fL (ref 79–97)
MONOS ABS: 1.1 10*3/uL — AB (ref 0.1–0.9)
Monocytes: 8 %
NEUTROS PCT: 78 %
Neutrophils Absolute: 11.3 10*3/uL — ABNORMAL HIGH (ref 1.4–7.0)
Platelets: 298 10*3/uL (ref 150–450)
RBC: 4.58 x10E6/uL (ref 3.77–5.28)
RDW: 13.5 % (ref 12.3–15.4)
RH TYPE: POSITIVE
RPR Ser Ql: NONREACTIVE
Rubella Antibodies, IGG: 8.19 index (ref >0.99)
WBC: 14.4 10*3/uL — AB (ref 3.4–10.8)

## 2018-03-12 LAB — HEMOGLOBINOPATHY EVALUATION
FERRITIN: 52 ng/mL (ref 15–77)
HGB A2 QUANT: 2.1 % (ref 1.8–3.2)
HGB F QUANT: 0 % (ref 0.0–2.0)
HGB SOLUBILITY: NEGATIVE
HGB VARIANT: 0 %
Hgb A: 97.9 % (ref 96.4–98.8)
Hgb C: 0 %
Hgb S: 0 %

## 2018-03-12 LAB — CYSTIC FIBROSIS MUTATION 97: GENE DIS ANAL CARRIER INTERP BLD/T-IMP: NOT DETECTED

## 2018-03-14 ENCOUNTER — Encounter: Payer: Self-pay | Admitting: General Practice

## 2018-03-29 ENCOUNTER — Ambulatory Visit (INDEPENDENT_AMBULATORY_CARE_PROVIDER_SITE_OTHER): Payer: Medicaid Other | Admitting: Obstetrics and Gynecology

## 2018-03-29 VITALS — BP 142/83 | HR 80 | Wt 161.0 lb

## 2018-03-29 DIAGNOSIS — O169 Unspecified maternal hypertension, unspecified trimester: Secondary | ICD-10-CM

## 2018-03-29 DIAGNOSIS — O162 Unspecified maternal hypertension, second trimester: Secondary | ICD-10-CM

## 2018-03-29 DIAGNOSIS — Z3400 Encounter for supervision of normal first pregnancy, unspecified trimester: Secondary | ICD-10-CM

## 2018-03-29 LAB — POCT URINALYSIS DIPSTICK OB
Bilirubin, UA: NEGATIVE
GLUCOSE, UA: NEGATIVE
Nitrite, UA: NEGATIVE
POC,PROTEIN,UA: NEGATIVE
Spec Grav, UA: 1.025 (ref 1.010–1.025)
Urobilinogen, UA: 1 E.U./dL
pH, UA: 7 (ref 5.0–8.0)

## 2018-03-29 NOTE — Progress Notes (Signed)
   PRENATAL VISIT NOTE  Subjective:  Colleen Steele is a 17 y.o. G2P0010 at [redacted]w[redacted]d being seen today for ongoing prenatal care.  She is currently monitored for the following issues for this low-risk pregnancy and has Supervision of normal first pregnancy, antepartum and Nausea/vomiting in pregnancy on their problem list.  Patient reports no complaints.  Contractions: Not present. Vag. Bleeding: None.  Movement: Present. Denies leaking of fluid.   The following portions of the patient's history were reviewed and updated as appropriate: allergies, current medications, past family history, past medical history, past social history, past surgical history and problem list. Problem list updated.  Objective:   Vitals:   03/29/18 1415  BP: (!) 142/83  Pulse: 80  Weight: 161 lb (73 kg)   Repeat BP: 138/82  Fetal Status: Fetal Heart Rate (bpm): 152,  S=D, Movement: Present     General:  Alert, oriented and cooperative. Patient is in no acute distress.  Skin: Skin is warm and dry. No rash noted.   Cardiovascular: Normal heart rate noted  Respiratory: Normal respiratory effort, no problems with respiration noted  Abdomen: Soft, gravid, appropriate for gestational age.  Pain/Pressure: Present     Pelvic: Cervical exam deferred        Extremities: Normal range of motion.  Edema: None  Mental Status: Normal mood and affect. Normal behavior. Normal judgment and thought content.   Assessment and Plan:  Pregnancy: G2P0010 at [redacted]w[redacted]d  1. Elevated blood pressure affecting pregnancy, antepartum  - Comprehensive metabolic panel - CBC - Protein / creatinine ratio, urine - POC Urinalysis Dipstick OB - Education material on taking BP and a BP record sheet  2. Supervision of normal first pregnancy, antepartum  - [place] MFM OB COMP + 14 WK; Future  Preterm labor symptoms and general obstetric precautions including but not limited to vaginal bleeding, contractions, leaking of fluid and fetal movement  were reviewed in detail with the patient. Please refer to After Visit Summary for other counseling recommendations.  Return in about 4 weeks (around 04/26/2018) for Return OB visit.  No future appointments.  [name], CNM

## 2018-03-29 NOTE — Patient Instructions (Signed)
How to Take Your Blood Pressure You can take your blood pressure at home with a machine. You may need to check your blood pressure at home:  To check if you have high blood pressure (hypertension).  To check your blood pressure over time.  To make sure your blood pressure medicine is working.  Supplies needed: You will need a blood pressure machine, or monitor. You can buy one at a drugstore or online. When choosing one:  Choose one with an arm cuff.  Choose one that wraps around your upper arm. Only one finger should fit between your arm and the cuff.  Do not choose one that measures your blood pressure from your wrist or finger.  Your doctor can suggest a monitor. How to prepare Avoid these things for 30 minutes before checking your blood pressure:  Drinking caffeine.  Drinking alcohol.  Eating.  Smoking.  Exercising.  Five minutes before checking your blood pressure:  Pee.  Sit in a dining chair. Avoid sitting in a soft couch or armchair.  Be quiet. Do not talk.  How to take your blood pressure Follow the instructions that came with your machine. If you have a digital blood pressure monitor, these may be the instructions: 1. Sit up straight. 2. Place your feet on the floor. Do not cross your ankles or legs. 3. Rest your left arm at the level of your heart. You may rest it on a table, desk, or chair. 4. Pull up your shirt sleeve. 5. Wrap the blood pressure cuff around the upper part of your left arm. The cuff should be 1 inch (2.5 cm) above your elbow. It is best to wrap the cuff around bare skin. 6. Fit the cuff snugly around your arm. You should be able to place only one finger between the cuff and your arm. 7. Put the cord inside the groove of your elbow. 8. Press the power button. 9. Sit quietly while the cuff fills with air and loses air. 10. Write down the numbers on the screen. 11. Wait 2-3 minutes and then repeat steps 1-10.  What do the numbers  mean? Two numbers make up your blood pressure. The first number is called systolic pressure. The second is called diastolic pressure. An example of a blood pressure reading is "120 over 80" (or 120/80). If you are an adult and do not have a medical condition, use this guide to find out if your blood pressure is normal: Normal  First number: below 120.  Second number: below 80. Elevated  First number: 120-129.  Second number: below 80. Hypertension stage 1  First number: 130-139.  Second number: 80-89. Hypertension stage 2  First number: 140 or above.  Second number: 90 or above. Your blood pressure is above normal even if only the top or bottom number is above normal. Follow these instructions at home:  Check your blood pressure as often as your doctor tells you to.  Take your monitor to your next doctor's appointment. Your doctor will: ? Make sure you are using it correctly. ? Make sure it is working right.  Make sure you understand what your blood pressure numbers should be.  Tell your doctor if your medicines are causing side effects. Contact a doctor if:  Your blood pressure keeps being high. Get help right away if:  Your first blood pressure number is higher than 180.  Your second blood pressure number is higher than 120. This information is not intended to replace advice given   to you by your health care provider. Make sure you discuss any questions you have with your health care provider. Document Released: 06/23/2008 Document Revised: 06/08/2016 Document Reviewed: 12/18/2015 Elsevier Interactive Patient Education  2018 [employer] Blood Pressure Record Sheet Your blood pressure on this visit to the emergency department or clinic is elevated. This does not necessarily mean you have high blood pressure (hypertension), but it does mean that your blood pressure needs to be rechecked. Many times your blood pressure can increase due to illness, pain, anxiety, or  other factors. We recommend that you get a series of blood pressure readings done over a period of 5 days. It is best to get a reading in the morning and one in the evening. You should make sure to sit and relax for 1-5 minutes before the reading is taken. Write the readings down and make a follow-up appointment with your health care provider to discuss the results. If there is not a free clinic or a drug store with a blood-pressure-taking machine near you, you can purchase blood-pressure-taking equipment from a drug store. Having one in the home allows you the convenience of taking your blood pressure while you are home and relaxed. Blood Pressure Log Date: _______________________  a.m. _____________________  p.m. _____________________  Date: _______________________  a.m. _____________________  p.m. _____________________  Date: _______________________  a.m. _____________________  p.m. _____________________  Date: _______________________  a.m. _____________________  p.m. _____________________  Date: _______________________  a.m. _____________________  p.m. _____________________  This information is not intended to replace advice given to you by your health care provider. Make sure you discuss any questions you have with your health care provider. Document Released: 04/09/2003 Document Revised: 06/24/2016 Document Reviewed: 09/03/2013 Elsevier Interactive Patient Education  2018 [employer].

## 2018-03-30 LAB — CBC
HEMOGLOBIN: 13.5 g/dL (ref 11.1–15.9)
Hematocrit: 39.1 % (ref 34.0–46.6)
MCH: 31.2 pg (ref 26.6–33.0)
MCHC: 34.5 g/dL (ref 31.5–35.7)
MCV: 90 fL (ref 79–97)
Platelets: 314 10*3/uL (ref 150–450)
RBC: 4.33 x10E6/uL (ref 3.77–5.28)
RDW: 13.1 % (ref 12.3–15.4)
WBC: 13.6 10*3/uL — ABNORMAL HIGH (ref 3.4–10.8)

## 2018-03-30 LAB — COMPREHENSIVE METABOLIC PANEL
ALT: 46 IU/L — AB (ref 0–32)
AST: 31 IU/L (ref 0–40)
Albumin/Globulin Ratio: 1.6 (ref 1.2–2.2)
Albumin: 4.2 g/dL (ref 3.5–5.5)
Alkaline Phosphatase: 77 IU/L (ref 39–117)
BUN/Creatinine Ratio: 9 (ref 9–23)
BUN: 5 mg/dL — ABNORMAL LOW (ref 6–20)
Bilirubin Total: 0.4 mg/dL (ref 0.0–1.2)
CALCIUM: 9.4 mg/dL (ref 8.7–10.2)
CO2: 19 mmol/L — AB (ref 20–29)
CREATININE: 0.57 mg/dL (ref 0.57–1.00)
Chloride: 103 mmol/L (ref 96–106)
GFR calc Af Amer: 155 mL/min/{1.73_m2} (ref >59)
GFR, EST NON AFRICAN AMERICAN: 135 mL/min/{1.73_m2} (ref >59)
GLOBULIN, TOTAL: 2.7 g/dL (ref 1.5–4.5)
Glucose: 90 mg/dL (ref 65–99)
Potassium: 3.8 mmol/L (ref 3.5–5.2)
Sodium: 128 mmol/L — ABNORMAL LOW (ref 134–144)
Total Protein: 6.9 g/dL (ref 6.0–8.5)

## 2018-03-30 LAB — PROTEIN / CREATININE RATIO, URINE
Creatinine, Urine: 178.1 mg/dL
Protein, Ur: 14.7 mg/dL
Protein/Creat Ratio: 83 mg/g creat (ref 0–200)

## 2018-03-30 NOTE — Progress Notes (Signed)
Subjective: Colleen Steele is a G2P0010 at [redacted]w[redacted]d who presents to the [facility] today for ob visit.  She does not have a history of any mental health concerns. She is currently sexually active. She is currently using no method for birth control. Colleen Steele has not received recent STD screening. Patient states family and father of baby as her support system.   BP (!) 142/83   Pulse 80   Wt 161 lb (73 kg)   LMP 11/29/2017 (Approximate) Comment: states she missed June  Birth Control History:  Depo and OCP  MDM Patient counseled on all options for birth control today including LARC. Patient desires Nexplanon or PP IUD initiated for birth control.   Assessment:  17 y.o. female considering nexplanon or PP IUD for birth control  Plan: Referrals to community agencies.   Gwyndolyn Saxon, LCSW 03/30/2018 10:43 AM

## 2018-04-06 ENCOUNTER — Encounter (HOSPITAL_COMMUNITY): Payer: Self-pay

## 2018-04-13 ENCOUNTER — Ambulatory Visit (HOSPITAL_COMMUNITY)
Admission: RE | Admit: 2018-04-13 | Discharge: 2018-04-13 | Disposition: A | Discharge status: Home / Self Care | Payer: Medicaid Other | Source: Ambulatory Visit | Attending: Obstetrics and Gynecology | Admitting: Obstetrics and Gynecology

## 2018-04-13 ENCOUNTER — Other Ambulatory Visit (HOSPITAL_COMMUNITY): Payer: Self-pay | Admitting: *Deleted

## 2018-04-13 ENCOUNTER — Other Ambulatory Visit: Payer: Self-pay | Admitting: Obstetrics and Gynecology

## 2018-04-13 DIAGNOSIS — O359XX0 Maternal care for (suspected) fetal abnormality and damage, unspecified, not applicable or unspecified: Secondary | ICD-10-CM | POA: Diagnosis not present

## 2018-04-13 DIAGNOSIS — Z3400 Encounter for supervision of normal first pregnancy, unspecified trimester: Secondary | ICD-10-CM

## 2018-04-13 DIAGNOSIS — Z363 Encounter for antenatal screening for malformations: Secondary | ICD-10-CM | POA: Insufficient documentation

## 2018-04-13 DIAGNOSIS — Z3402 Encounter for supervision of normal first pregnancy, second trimester: Secondary | ICD-10-CM | POA: Insufficient documentation

## 2018-04-13 DIAGNOSIS — Z362 Encounter for other antenatal screening follow-up: Secondary | ICD-10-CM

## 2018-04-13 DIAGNOSIS — Z3A17 17 weeks gestation of pregnancy: Secondary | ICD-10-CM | POA: Diagnosis not present

## 2018-04-19 ENCOUNTER — Inpatient Hospital Stay (HOSPITAL_COMMUNITY)
Admission: AD | Admit: 2018-04-19 | Discharge: 2018-04-20 | Disposition: A | Discharge status: Home / Self Care | Payer: Medicaid Other | Source: Ambulatory Visit | Attending: Obstetrics & Gynecology | Admitting: Obstetrics & Gynecology

## 2018-04-19 DIAGNOSIS — O4692 Antepartum hemorrhage, unspecified, second trimester: Secondary | ICD-10-CM

## 2018-04-19 DIAGNOSIS — A5901 Trichomonal vulvovaginitis: Secondary | ICD-10-CM | POA: Insufficient documentation

## 2018-04-19 DIAGNOSIS — O98312 Other infections with a predominantly sexual mode of transmission complicating pregnancy, second trimester: Secondary | ICD-10-CM | POA: Insufficient documentation

## 2018-04-19 DIAGNOSIS — A599 Trichomoniasis, unspecified: Secondary | ICD-10-CM | POA: Diagnosis present

## 2018-04-19 DIAGNOSIS — Z3A18 18 weeks gestation of pregnancy: Secondary | ICD-10-CM | POA: Insufficient documentation

## 2018-04-19 HISTORY — DX: Chronic kidney disease, unspecified: N18.9

## 2018-04-20 ENCOUNTER — Encounter (HOSPITAL_COMMUNITY): Payer: Self-pay

## 2018-04-20 DIAGNOSIS — A549 Gonococcal infection, unspecified: Secondary | ICD-10-CM | POA: Insufficient documentation

## 2018-04-20 DIAGNOSIS — O4692 Antepartum hemorrhage, unspecified, second trimester: Secondary | ICD-10-CM | POA: Diagnosis not present

## 2018-04-20 DIAGNOSIS — A749 Chlamydial infection, unspecified: Secondary | ICD-10-CM | POA: Insufficient documentation

## 2018-04-20 DIAGNOSIS — A599 Trichomoniasis, unspecified: Secondary | ICD-10-CM

## 2018-04-20 DIAGNOSIS — A5901 Trichomonal vulvovaginitis: Secondary | ICD-10-CM | POA: Diagnosis not present

## 2018-04-20 DIAGNOSIS — O209 Hemorrhage in early pregnancy, unspecified: Secondary | ICD-10-CM | POA: Diagnosis present

## 2018-04-20 DIAGNOSIS — O98312 Other infections with a predominantly sexual mode of transmission complicating pregnancy, second trimester: Secondary | ICD-10-CM | POA: Diagnosis not present

## 2018-04-20 DIAGNOSIS — Z3A18 18 weeks gestation of pregnancy: Secondary | ICD-10-CM | POA: Diagnosis not present

## 2018-04-20 HISTORY — DX: Trichomoniasis, unspecified: A59.9

## 2018-04-20 LAB — WET PREP, GENITAL
CLUE CELLS WET PREP: NONE SEEN
SPERM: NONE SEEN

## 2018-04-20 LAB — GC/CHLAMYDIA PROBE AMP (~~LOC~~) NOT AT ARMC
CHLAMYDIA, DNA PROBE: NEGATIVE
Neisseria Gonorrhea: NEGATIVE

## 2018-04-20 LAB — URINALYSIS, ROUTINE W REFLEX MICROSCOPIC
BILIRUBIN URINE: NEGATIVE
GLUCOSE, UA: NEGATIVE mg/dL
Ketones, ur: NEGATIVE mg/dL
Nitrite: NEGATIVE
PH: 6 (ref 5.0–8.0)
Protein, ur: NEGATIVE mg/dL
SPECIFIC GRAVITY, URINE: 1.025 (ref 1.005–1.030)

## 2018-04-20 MED ORDER — METRONIDAZOLE 500 MG PO TABS
2000.0000 mg | ORAL_TABLET | Freq: Once | ORAL | Status: AC
  Administered 2018-04-20: 2000 mg via ORAL
  Filled 2018-04-20: qty 4

## 2018-04-20 MED ORDER — ONDANSETRON 4 MG PO TBDP
4.0000 mg | ORAL_TABLET | Freq: Once | ORAL | Status: AC
  Administered 2018-04-20: 4 mg via ORAL
  Filled 2018-04-20: qty 1

## 2018-04-20 NOTE — MAU Note (Signed)
E-signature would not work in the room. Pt signed D/C instructions and verbalized understanding for F/U care.

## 2018-04-20 NOTE — MAU Note (Signed)
Pt here with c/o vaginal bleeding; denies pain. Denies any leaking.

## 2018-04-20 NOTE — Discharge Instructions (Signed)
Pelvic Rest Pelvic rest may be recommended if:  Your placenta is partially or completely covering the opening of your cervix (placenta previa).  There is bleeding between the wall of the uterus and the amniotic sac in the first trimester of pregnancy (subchorionic hemorrhage).  You went into labor too early (preterm labor).  Based on your overall health and the health of your baby, your health care provider will decide if pelvic rest is right for you. How do I rest my pelvis? For as long as told by your health care provider:  Do not have sex, sexual stimulation, or an orgasm.  Do not use tampons. Do not douche. Do not put anything in your vagina.  Do not lift anything that is heavier than 10 lb (4.5 kg).  Avoid activities that take a lot of effort (are strenuous).  Avoid any activity in which your pelvic muscles could become strained.  When should I seek medical care? Seek medical care if you have:  Cramping pain in your lower abdomen.  Vaginal discharge.  A low, dull backache.  Regular contractions.  Uterine tightening.  When should I seek immediate medical care? Seek immediate medical care if:  You have vaginal bleeding and you are pregnant.  This information is not intended to replace advice given to you by your health care provider. Make sure you discuss any questions you have with your health care provider. Document Released: 11/05/2010 Document Revised: 12/17/2015 Document Reviewed: 01/12/2015 Elsevier Interactive Patient Education  2018 [employer].   Trichomoniasis Trichomoniasis is an STI (sexually transmitted infection) that can affect both women and men. In women, the outer area of the female genitalia (vulva) and the vagina are affected. In men, the penis is mainly affected, but the prostate and other reproductive organs can also be involved. This condition can be treated with medicine. It often has no symptoms (is asymptomatic), especially in men. What  are the causes? This condition is caused by an organism called Trichomonas vaginalis. Trichomoniasis most often spreads from person to person (is contagious) through sexual contact. What increases the risk? The following factors may make you more likely to develop this condition:  Having unprotected sexual intercourse.  Having sexual intercourse with a partner who has trichomoniasis.  Having multiple sexual partners.  Having had previous trichomoniasis infections or other STIs.  What are the signs or symptoms? In women, symptoms of trichomoniasis include:  Abnormal vaginal discharge that is clear, white, gray, or yellow-green and foamy and has an unusual "fishy" odor.  Itching and irritation of the vagina and vulva.  Burning or pain during urination or sexual intercourse.  Genital redness and swelling.  In men, symptoms of trichomoniasis include:  Penile discharge that may be foamy or contain pus.  Pain in the penis. This may happen only when urinating.  Itching or irritation inside the penis.  Burning after urination or ejaculation.  How is this diagnosed? In women, this condition may be found during a routine Pap test or physical exam. It may be found in men during a routine physical exam. Your health care provider may perform tests to help diagnose this infection, such as:  Urine tests (men and women).  The following in women: ? Testing the pH of the vagina. ? A vaginal swab test that checks for the Trichomonas vaginalis organism. ? Testing vaginal secretions.  Your health care provider may test you for other STIs, including HIV (human immunodeficiency virus). How is this treated? This condition is treated with medicine  taken by mouth (orally), such as metronidazole or tinidazole to fight the infection. Your sexual partner(s) may also need to be tested and treated.  If you are a woman and you plan to become pregnant or think you may be pregnant, tell your health  care provider right away. Some medicines that are used to treat the infection should not be taken during pregnancy.  Your health care provider may recommend over-the-counter medicines or creams to help relieve itching or irritation. You may be tested for infection again 3 months after treatment. Follow these instructions at home:  Take and use over-the-counter and prescription medicines, including creams, only as told by your health care provider.  Do not have sexual intercourse until one week after you finish your medicine, or until your health care provider approves. Ask your health care provider when you may resume sexual intercourse.  (Women) Do not douche or wear tampons while you have the infection.  Discuss your infection with your sexual partner(s). Make sure that your partner gets tested and treated, if necessary.  Keep all follow-up visits as told by your health care provider. This is important. How is this prevented?  Use condoms every time you have sex. Using condoms correctly and consistently can help protect against STIs.  Avoid having multiple sexual partners.  Talk with your sexual partner about any symptoms that either of you may have, as well as any history of STIs.  Get tested for STIs and STDs (sexually transmitted diseases) before you have sex. Ask your partner to do the same.  Do not have sexual contact if you have symptoms of trichomoniasis or another STI. Contact a health care provider if:  You still have symptoms after you finish your medicine.  You develop pain in your abdomen.  You have pain when you urinate.  You have bleeding after sexual intercourse.  You develop a rash.  You feel nauseous or you vomit.  You plan to become pregnant or think you may be pregnant. Summary  Trichomoniasis is an STI (sexually transmitted infection) that can affect both women and men.  This condition often has no symptoms (is asymptomatic), especially in men.  You  should not have sexual intercourse until one week after you finish your medicine, or until your health care provider approves. Ask your health care provider when you may resume sexual intercourse.  Discuss your infection with your sexual partner. Make sure that your partner gets tested and treated, if necessary. This information is not intended to replace advice given to you by your health care provider. Make sure you discuss any questions you have with your health care provider. Document Released: 01/04/2001 Document Revised: 06/03/2016 Document Reviewed: 06/03/2016 Elsevier Interactive Patient Education  2017 [employer].  Expedited Partner Therapy:  Information Sheet for Patients and Partners               You have been offered expedited partner therapy (EPT). This information sheet contains important information and warnings you need to be aware of, so please read it carefully.   Expedited Partner Therapy (EPT) is the clinical practice of treating the sexual partners of persons who receive chlamydia, gonorrhea, or trichomoniasis diagnoses by providing medications or prescriptions to the patient. Patients then provide partners with these therapies without the health-care provider having examined the partner. In other words, EPT is a convenient, fast and private way for patients to help their sexual partners get treated.   Chlamydia and gonorrhea are bacterial infections you get from having  sex with a person who is already infected. Trichomoniasis (or trich) is a very common sexually transmitted infection (STI) that is caused by infection with a protozoan parasite called Trichomonas vaginalis.  Many people with these infections dont know it because they feel fine, but without treatment these infections can cause serious health problems, such as pelvic inflammatory disease, ectopic pregnancy, infertility and increased risk of HIV.   It is important to get treated as soon as possible to protect  your health, to avoid spreading these infections to others, and to prevent yourself from becoming re-infected. The good news is these infections can be easily cured with proper antibiotic medicine. The best way to take care of your self is to see a doctor or go to your local health department. If you are not able to see a doctor or other medical provider, you should take EPT.    Recommended Medication: EPT for Chlamydia:  Azithromycin (Zithromax) 1 gram orally in a single dose EPT for Gonorrhea:  Cefixime (Suprax) 400 milligrams orally in a single dose PLUS azithromycin (Zithromax) 1 gram orally in a single dose EPT for Trichomoniasis:  Metronidazole (Flagyl) 2 grams orally in a single dose   These medicines are very safe. However, you should not take them if you have ever had an allergic reaction (like a rash) to any of these medicines: azithromycin (Zithromax), erythromycin, clarithromycin (Biaxin), metronidazole (Flagyl), tinidazole (Tindimax). If you are uncertain about whether you have an allergy, call your medical provider or pharmacist before taking this medicine. If you have a serious, long-term illness like kidney, liver or heart disease, colitis or stomach problems, or you are currently taking other prescription medication, talk to your provider before taking this medication.   Women: If you have lower belly pain, pain during sex, vomiting, or a fever, do not take this medicine. Instead, you should see a medical provider to be certain you do not have pelvic inflammatory disease (PID). PID can be serious and lead to infertility, pregnancy problems or chronic pelvic pain.   Pregnant Women: It is very important for you to see a doctor to get pregnancy services and pre-natal care. These antibiotics for EPT are safe for pregnant women, but you still need to see a medical provider as soon as possible. It is also important to note that Doxycycline is an alternative therapy for chlamydia, but it  should not be taken by someone who is pregnant.   Men: If you have pain or swelling in the testicles or a fever, do not take this medicine and see a medical provider.     Men who have sex with men (MSM): MSM in [place] continue to experience high rates of syphilis and HIV. Many MSM with gonorrhea or chlamydia could also have syphilis and/or HIV and not know it. If you are a man who has sex with other men, it is very important that you see a medical provider and are tested for HIV and syphilis. EPT is not recommended for gonorrhea for MSM.  Recommended treatment for gonorrhea for MSM is Rocephin (shot) AND azithromycin due to decreased cure rate.  Please see your medical provider if this is the case.    Along with this information sheet is a prescription for the medicine. If you receive a prescription it will be in your name and will indicate your date of birth, or it will be in the name of Expedited Partner Therapy.   In either case, you can have the prescription filled  at a pharmacy. You will be responsible for the cost of the medicine, unless you have prescription drug coverage. In that case, you could provide your name so the pharmacy could bill your health plan.   Take the medication as directed. Some people will have a mild, upset stomach, which does not last long. AVOID alcohol 24 hours after taking metronidazole (Flagyl) to reduce the possibility of a disulfiram-like reaction (severe vomiting and abdominal pain).  After taking the medicine, do not have sex for 7 days. Do not share this medicine or give it to anyone else. It is important to tell everyone you have had sex with in the last 60 days that they need to go and get tested for sexually transmitted infections.   Ways to prevent these and other sexually transmitted infections (STIs):    Abstain from sex. This is the only sure way to avoid getting an STI.   Use barrier methods, such as condoms, consistently and correctly.   Limit  the number of sexual partners.   Have regular physical exams, including testing for STIs.   For more information about EPT or other issues pertaining to an STI, please contact your medical provider or the [facility] Department at [phone] or [email or web address]

## 2018-04-20 NOTE — MAU Provider Note (Signed)
Chief Complaint:  Vaginal Bleeding   First Provider Initiated Contact with Patient 04/20/18 0042     HPI: Colleen Steele is a 17 y.o. G2P0010 at 8w5dwho presents to maternity admissions reporting vaginal bleeding after getting out of shower.  Has not had bleeding before. .Placenta is anterior, no previa She reports good fetal movement, denies LOF, vaginal itching/burning, urinary symptoms, h/a, dizziness, n/v, diarrhea, constipation or fever/chills.    Vaginal Bleeding  The patient's primary symptoms include vaginal bleeding. The patient's pertinent negatives include no genital itching, genital lesions, genital odor or pelvic pain. This is a new problem. The current episode started today. The problem occurs intermittently. The problem has been gradually improving. The patient is experiencing no pain. The problem affects both sides. She is pregnant. Pertinent negatives include no abdominal pain, back pain, constipation or diarrhea. The vaginal discharge was bloody. The vaginal bleeding is lighter than menses. She has not been passing clots. She has not been passing tissue. Nothing aggravates the symptoms. She has tried nothing for the symptoms. She is sexually active. It is unknown whether or not her partner has an STD. She uses nothing for contraception.   RN Note: Pt here with c/o vaginal bleeding; denies pain. Denies any leaking.   Past Medical History: Past Medical History:  Diagnosis Date  . Asthma   . Chronic kidney disease   . Diarrhea   . Environmental allergies   . Vomiting     Past obstetric history: OB History  Gravida Para Term Preterm AB Living  2 0     1    SAB TAB Ectopic Multiple Live Births               # Outcome Date GA Lbr Len/2nd Weight Sex Delivery Anes PTL Lv  2 Current           1 AB             Past Surgical History: Past Surgical History:  Procedure Laterality Date  . NO PAST SURGERIES      Family History: Family History  Problem Relation Age of  Onset  . Diverticulitis Sister   . Hypertension Mother     Social History: Social History   Tobacco Use  . Smoking status: Never Smoker  . Smokeless tobacco: Never Used  Substance Use Topics  . Alcohol use: No  . Drug use: Not Currently    Types: Marijuana    Comment: Stopped 01/22/18    Allergies: No Known Allergies  Meds:  Medications Prior to Admission  Medication Sig Dispense Refill Last Dose  . Prenatal MV-Min-FA-Omega-3 (PRENATAL GUMMIES/DHA & FA) 0.4-32.5 MG CHEW Chew 1 each by mouth daily. 30 tablet 12 Past Month at Unknown time  . albuterol (PROVENTIL HFA;VENTOLIN HFA) 108 (90 Base) MCG/ACT inhaler Inhale 1-2 puffs into the lungs every 4 (four) hours as needed for wheezing or shortness of breath.   More than a month at Unknown time  . doxylamine, Sleep, (UNISOM) 25 MG tablet Take 25 mg by mouth at bedtime as needed for sleep.   More than a month at Unknown time  . ibuprofen (ADVIL,MOTRIN) 600 MG tablet Take 600 mg by mouth every 4 (four) hours as needed for headache.   More than a month at Unknown time  . ondansetron (ZOFRAN) 4 MG tablet Take 1 tablet (4 mg total) by mouth every 8 (eight) hours as needed for nausea or vomiting. (Patient not taking: Reported on 03/29/2018) 12 tablet 0 More than a  month at Unknown time    I have reviewed patient's Past Medical Hx, Surgical Hx, Family Hx, Social Hx, medications and allergies.   ROS:  Review of Systems  Gastrointestinal: Negative for abdominal pain, constipation and diarrhea.  Genitourinary: Positive for vaginal bleeding. Negative for pelvic pain.  Musculoskeletal: Negative for back pain.   Other systems negative  Physical Exam   Patient Vitals for the past 24 hrs:  BP Temp Temp src Pulse Resp SpO2 Height Weight  04/20/18 0007 136/77 98.3 F (36.8 C) Oral (!) 111 18 98 % 5\' 1"  (1.549 m) 74.8 kg   Constitutional: Well-developed, well-nourished female in no acute distress.  Cardiovascular: normal rate and  rhythm Respiratory: normal effort, clear to auscultation bilaterally GI: Abd soft, non-tender, gravid appropriate for gestational age.   No rebound or guarding. MS: Extremities nontender, no edema, normal ROM Neurologic: Alert and oriented x 4.  GU: Neg CVAT.  PELVIC EXAM: Cervix pink, visually closed, without lesion, scant red discharge, vaginal walls and external genitalia normal  Cervix long and closed  FHT:  140   Labs: Results for orders placed or performed during the hospital encounter of 04/19/18 (from the past 24 hour(s))  Urinalysis, Routine w reflex microscopic     Status: Abnormal   Collection Time: 04/20/18 12:51 AM  Result Value Ref Range   Color, Urine YELLOW YELLOW   APPearance HAZY (A) CLEAR   Specific Gravity, Urine 1.025 1.005 - 1.030   pH 6.0 5.0 - 8.0   Glucose, UA NEGATIVE NEGATIVE mg/dL   Hgb urine dipstick LARGE (A) NEGATIVE   Bilirubin Urine NEGATIVE NEGATIVE   Ketones, ur NEGATIVE NEGATIVE mg/dL   Protein, ur NEGATIVE NEGATIVE mg/dL   Nitrite NEGATIVE NEGATIVE   Leukocytes, UA LARGE (A) NEGATIVE   RBC / HPF 6-10 0 - 5 RBC/hpf   WBC, UA 21-50 0 - 5 WBC/hpf   Bacteria, UA FEW (A) NONE SEEN   Squamous Epithelial / LPF 11-20 0 - 5   Mucus PRESENT    Trichomonas, UA PRESENT (A) NONE SEEN   Ca Oxalate Crys, UA PRESENT   Wet prep, genital     Status: Abnormal   Collection Time: 04/20/18  1:14 AM  Result Value Ref Range   Yeast Wet Prep HPF POC PRESENT (A) NONE SEEN   Trich, Wet Prep PRESENT (A) NONE SEEN   Clue Cells Wet Prep HPF POC NONE SEEN NONE SEEN   WBC, Wet Prep HPF POC MANY (A) NONE SEEN   Sperm NONE SEEN     O/Positive/-- (08/08 1549)  Imaging:    MAU Course/MDM: I have ordered labs and reviewed results. Discussed positive Trichomonas results   Discussed that this can cause cervical friability and is likely source of bleeding.  Was treated for Chlamydia and Gonorrhea in July with negative TOC in August.   Treatments in MAU included  Flagyl with premedication with Zofran.   Partner expedited treatment Rx provided  Assessment: Single intrauterine pregnancy at [redacted]w[redacted]d Vaginal bleeding in second trimester Trichomonal vaginitis  Plan: Discharge home Pelvic rest x 2-3 weeks with protected IC thereafter Partner Rx provided with instructions. Preterm Labor precautions and fetal kick counts Follow up in Office for prenatal visits and recheck of status  Encouraged to return here or to other Urgent Care/ED if she develops worsening of symptoms, increase in pain, fever, or other concerning symptoms.   Pt stable at time of discharge.  Wynelle Bourgeois CNM, MSN Certified Nurse-Midwife 04/20/2018 12:42 AM

## 2018-04-26 ENCOUNTER — Other Ambulatory Visit: Payer: Self-pay

## 2018-04-26 ENCOUNTER — Ambulatory Visit (INDEPENDENT_AMBULATORY_CARE_PROVIDER_SITE_OTHER): Payer: Medicaid Other | Admitting: Obstetrics and Gynecology

## 2018-04-26 VITALS — BP 138/88 | HR 103 | Wt 161.0 lb

## 2018-04-26 DIAGNOSIS — O219 Vomiting of pregnancy, unspecified: Secondary | ICD-10-CM

## 2018-04-26 DIAGNOSIS — A599 Trichomoniasis, unspecified: Secondary | ICD-10-CM

## 2018-04-26 DIAGNOSIS — Z3400 Encounter for supervision of normal first pregnancy, unspecified trimester: Secondary | ICD-10-CM

## 2018-04-26 MED ORDER — ONDANSETRON 4 MG PO TBDP: 4.0000 mg | ORAL_TABLET | Freq: Three times a day (TID) | ORAL | 0 refills | Status: DC | PRN

## 2018-04-26 MED ORDER — METRONIDAZOLE 500 MG PO TABS: ORAL_TABLET | ORAL | 0 refills | Status: DC

## 2018-04-26 NOTE — Progress Notes (Signed)
   PRENATAL VISIT NOTE  Subjective:  Colleen Steele is a 17 y.o. G2P0010 at [redacted]w[redacted]d being seen today for ongoing prenatal care.  She is currently monitored for the following issues for this low-risk pregnancy and has Supervision of normal first pregnancy, antepartum; Nausea/vomiting in pregnancy; Trichomonal infection; Gonorrhea; and Chlamydia on their problem list.  Patient reports backache. She reports (+) quickening this week.She is requesting to be re-treated for Trich, because she vomited "shortly after taking the medication." Contractions: Not present. Vag. Bleeding: None.  Movement: Present. Denies leaking of fluid.   The following portions of the patient's history were reviewed and updated as appropriate: allergies, current medications, past family history, past medical history, past social history, past surgical history and problem list. Problem list updated.  Objective:   Vitals:   04/26/18 1046  BP: 138/88  Pulse: (!) 103  Weight: 161 lb (73 kg)    Fetal Status: Fetal Heart Rate (bpm): 143 Fundal Height: 19 cm Movement: Present     General:  Alert, oriented and cooperative. Patient is in no acute distress.  Skin: Skin is warm and dry. No rash noted.   Cardiovascular: Normal heart rate noted  Respiratory: Normal respiratory effort, no problems with respiration noted  Abdomen: Soft, gravid, appropriate for gestational age.  Pain/Pressure: Absent     Pelvic: Cervical exam deferred        Extremities: Normal range of motion.  Edema: None  Mental Status: Normal mood and affect. Normal behavior. Normal judgment and thought content.   Assessment and Plan:  Pregnancy: G2P0010 at [redacted]w[redacted]d  1. Nausea/vomiting in pregnancy  - Rx for ondansetron (ZOFRAN ODT) 4 MG disintegrating tablet  2. Supervision of normal first pregnancy, antepartum  - Anticipatory guidance for nv  3. Trichomonal infection - Rx for metroNIDAZOLE (FLAGYL) 2000 MG tablet // Advised to take Zofran 1 hour prior  to Flagyl and take after meal - Patient verbalized an understanding of the plan of care and agrees.    Preterm labor symptoms and general obstetric precautions including but not limited to vaginal bleeding, contractions, leaking of fluid and fetal movement were reviewed in detail with the patient. Please refer to After Visit Summary for other counseling recommendations.  Return in about 4 weeks (around 05/24/2018) for Return OB visit.  Future Appointments  Date Time Provider Department Center  05/11/2018 10:30 AM [facility] [place] 1 [facility]  05/24/2018  2:10 PM [name], CNM [facility] None    [name], [place]

## 2018-04-27 ENCOUNTER — Other Ambulatory Visit: Payer: Self-pay | Admitting: *Deleted

## 2018-04-27 DIAGNOSIS — A599 Trichomoniasis, unspecified: Secondary | ICD-10-CM

## 2018-04-27 MED ORDER — METRONIDAZOLE 500 MG PO TABS: 500.0000 mg | ORAL_TABLET | Freq: Two times a day (BID) | ORAL | 0 refills | Status: DC

## 2018-04-27 NOTE — Progress Notes (Signed)
Patient could not keep Metronidazole 2 gm PO x 1 down. Metronidazole 500 mg 1 PO BID x 7 days sent to pharmacy.  Clovis Pu, RN

## 2018-05-11 ENCOUNTER — Ambulatory Visit (HOSPITAL_COMMUNITY)
Admission: RE | Admit: 2018-05-11 | Discharge: 2018-05-11 | Disposition: A | Discharge status: Home / Self Care | Payer: Medicaid Other | Source: Ambulatory Visit | Attending: Obstetrics and Gynecology | Admitting: Obstetrics and Gynecology

## 2018-05-11 DIAGNOSIS — O359XX0 Maternal care for (suspected) fetal abnormality and damage, unspecified, not applicable or unspecified: Secondary | ICD-10-CM

## 2018-05-11 DIAGNOSIS — Z362 Encounter for other antenatal screening follow-up: Secondary | ICD-10-CM

## 2018-05-11 DIAGNOSIS — Z3A21 21 weeks gestation of pregnancy: Secondary | ICD-10-CM | POA: Diagnosis not present

## 2018-05-11 DIAGNOSIS — O358XX0 Maternal care for other (suspected) fetal abnormality and damage, not applicable or unspecified: Secondary | ICD-10-CM | POA: Insufficient documentation

## 2018-05-22 ENCOUNTER — Other Ambulatory Visit (HOSPITAL_COMMUNITY)
Admission: RE | Admit: 2018-05-22 | Discharge: 2018-05-22 | Disposition: A | Discharge status: Home / Self Care | Payer: Medicaid Other | Source: Ambulatory Visit | Attending: Obstetrics & Gynecology | Admitting: Obstetrics & Gynecology

## 2018-05-22 ENCOUNTER — Ambulatory Visit (INDEPENDENT_AMBULATORY_CARE_PROVIDER_SITE_OTHER): Payer: Medicaid Other | Admitting: Obstetrics & Gynecology

## 2018-05-22 ENCOUNTER — Other Ambulatory Visit: Payer: Self-pay

## 2018-05-22 VITALS — BP 121/78 | HR 88 | Wt 164.8 lb

## 2018-05-22 DIAGNOSIS — Z8619 Personal history of other infectious and parasitic diseases: Secondary | ICD-10-CM | POA: Diagnosis not present

## 2018-05-22 DIAGNOSIS — B373 Candidiasis of vulva and vagina: Secondary | ICD-10-CM | POA: Insufficient documentation

## 2018-05-22 DIAGNOSIS — A599 Trichomoniasis, unspecified: Secondary | ICD-10-CM | POA: Diagnosis present

## 2018-05-22 DIAGNOSIS — R51 Headache: Secondary | ICD-10-CM

## 2018-05-22 DIAGNOSIS — Z09 Encounter for follow-up examination after completed treatment for conditions other than malignant neoplasm: Secondary | ICD-10-CM | POA: Diagnosis not present

## 2018-05-22 DIAGNOSIS — R519 Headache, unspecified: Secondary | ICD-10-CM

## 2018-05-22 DIAGNOSIS — Z3400 Encounter for supervision of normal first pregnancy, unspecified trimester: Secondary | ICD-10-CM

## 2018-05-22 DIAGNOSIS — O26892 Other specified pregnancy related conditions, second trimester: Secondary | ICD-10-CM

## 2018-05-22 DIAGNOSIS — G43909 Migraine, unspecified, not intractable, without status migrainosus: Secondary | ICD-10-CM | POA: Insufficient documentation

## 2018-05-22 MED ORDER — BUTALBITAL-APAP-CAFFEINE 50-325-40 MG PO CAPS: 1.0000 | ORAL_CAPSULE | Freq: Four times a day (QID) | ORAL | 3 refills | Status: DC | PRN

## 2018-05-22 NOTE — Patient Instructions (Signed)
TDaP Vaccine Pregnancy Get the Whooping Cough Vaccine While You Are Pregnant (CDC)  It is important for women to get the whooping cough vaccine in the third trimester of each pregnancy. Vaccines are the best way to prevent this disease. There are 2 different whooping cough vaccines. Both vaccines combine protection against whooping cough, tetanus and diphtheria, but they are for different age groups: Tdap: for everyone 11 years or older, including pregnant women  DTaP: for children 2 months through 6 years of age  You need the whooping cough vaccine during each of your pregnancies The recommended time to get the shot is during your 27th through 36th week of pregnancy, preferably during the earlier part of this time period. The Centers for Disease Control and Prevention (CDC) recommends that pregnant women receive the whooping cough vaccine for adolescents and adults (called Tdap vaccine) during the third trimester of each pregnancy. The recommended time to get the shot is during your 27th through 36th week of pregnancy, preferably during the earlier part of this time period. This replaces the original recommendation that pregnant women get the vaccine only if they had not previously received it. The American College of Obstetricians and Gynecologists and the American College of Nurse-Midwives support this recommendation.  You should get the whooping cough vaccine while pregnant to pass protection to your baby frame support disabled and/or not supported in this browser  Learn why Colleen Steele decided to get the whooping cough vaccine in her 3rd trimester of pregnancy and how her baby girl was born with some protection against the disease. Also available on YouTube. After receiving the whooping cough vaccine, your body will create protective antibodies (proteins produced by the body to fight off diseases) and pass some of them to your baby before birth. These antibodies provide your baby some short-term  protection against whooping cough in early life. These antibodies can also protect your baby from some of the more serious complications that come along with whooping cough. Your protective antibodies are at their highest about 2 weeks after getting the vaccine, but it takes time to pass them to your baby. So the preferred time to get the whooping cough vaccine is early in your third trimester. The amount of whooping cough antibodies in your body decreases over time. That is why CDC recommends you get a whooping cough vaccine during each pregnancy. Doing so allows each of your babies to get the greatest number of protective antibodies from you. This means each of your babies will get the best protection possible against this disease.  Getting the whooping cough vaccine while pregnant is better than getting the vaccine after you give birth Whooping cough vaccination during pregnancy is ideal so your baby will have short-term protection as soon as he is born. This early protection is important because your baby will not start getting his whooping cough vaccines until he is 2 months old. These first few months of life are when your baby is at greatest risk for catching whooping cough. This is also when he's at greatest risk for having severe, potentially life-threating complications from the infection. To avoid that gap in protection, it is best to get a whooping cough vaccine during pregnancy. You will then pass protection to your baby before he is born. To continue protecting your baby, he should get whooping cough vaccines starting at 2 months old. You may never have gotten the Tdap vaccine before and did not get it during this pregnancy. If so, you should make sure   to get the vaccine immediately after you give birth, before leaving the hospital or birthing center. It will take about 2 weeks before your body develops protection (antibodies) in response to the vaccine. Once you have protection from the vaccine,  you are less likely to give whooping cough to your newborn while caring for him. But remember, your baby will still be at risk for catching whooping cough from others. A recent study looked to see how effective Tdap was at preventing whooping cough in babies whose mothers got the vaccine while pregnant or in the hospital after giving birth. The study found that getting Tdap between 27 through 36 weeks of pregnancy is 85% more effective at preventing whooping cough in babies younger than 2 months old. Blood tests cannot tell if you need a whooping cough vaccine There are no blood tests that can tell you if you have enough antibodies in your body to protect yourself or your baby against whooping cough. Even if you have been sick with whooping cough in the past or previously received the vaccine, you still should get the vaccine during each pregnancy. Breastfeeding may pass some protective antibodies onto your baby By breastfeeding, you may pass some antibodies you have made in response to the vaccine to your baby. When you get a whooping cough vaccine during your pregnancy, you will have antibodies in your breast milk that you can share with your baby as soon as your milk comes in. However, your baby will not get protective antibodies immediately if you wait to get the whooping cough vaccine until after delivering your baby. This is because it takes about 2 weeks for your body to create antibodies. Learn more about the health benefits of breastfeeding.  

## 2018-05-22 NOTE — Progress Notes (Signed)
   PRENATAL VISIT NOTE  Subjective:  Colleen Steele is a 17 y.o. G2P0010 at [redacted]w[redacted]d being seen today for ongoing prenatal care.  She is currently monitored for the following issues for this low-risk pregnancy and has Supervision of normal first pregnancy, antepartum; Nausea/vomiting in pregnancy; Trichomonal infection; Gonorrhea; Chlamydia; and Migraine headache on their problem list.  Patient reports headaches daily, taking Tylenol 500 mg daily. Not helping. History of migraines when not pregnant, usually helped with Motrin.  Contractions: Not present. Vag. Bleeding: None.  Movement: Present. Denies leaking of fluid.   The following portions of the patient's history were reviewed and updated as appropriate: allergies, current medications, past family history, past medical history, past social history, past surgical history and problem list. Problem list updated.  Objective:   Vitals:   05/22/18 1349  BP: 121/78  Pulse: 88  Weight: 164 lb 12.8 oz (74.8 kg)    Fetal Status: Fetal Heart Rate (bpm): 144   Movement: Present     General:  Alert, oriented and cooperative. Patient is in no acute distress.  Skin: Skin is warm and dry. No rash noted.   Cardiovascular: Normal heart rate noted  Respiratory: Normal respiratory effort, no problems with respiration noted  Abdomen: Soft, gravid, appropriate for gestational age.  Pain/Pressure: Absent     Pelvic: Cervical exam deferred        Extremities: Normal range of motion.  Edema: None  Mental Status: Normal mood and affect. Normal behavior. Normal judgment and thought content.   Assessment and Plan:  Pregnancy: G2P0010 at [redacted]w[redacted]d  1. Headache in pregnancy, second trimester Advised to keep hydrated, take Tylenol 1000 mg po q6h as needed. If no relief, Fioricet ordered. Will also refer to Headache specialist at [facility]. - Butalbital-APAP-Caffeine 50-325-40 MG capsule; Take 1-2 capsules by mouth every 6 (six) hours as needed for  headache.  Dispense: 30 capsule; Refill: 3 - AMB referral to headache clinic  2. Trichomonal infection TOC done today - Cervicovaginal ancillary only  3. Supervision of normal first pregnancy, antepartum Preterm labor symptoms and general obstetric precautions including but not limited to vaginal bleeding, contractions, leaking of fluid and fetal movement were reviewed in detail with the patient. Please refer to After Visit Summary for other counseling recommendations.  Return in about 4 weeks (around 06/19/2018) for 2 hr GTT, 3rd trimester labs, TDap, OB Visit.  Future Appointments  Date Time Provider Department Center  05/22/2018  2:10 PM Janalee Grobe, Jethro Bastos, MD [facility] None  06/20/2018  8:50 AM [facility] None  06/20/2018  9:10 AM Bur[place]on, Brand Males, NP [facility] None    Jaynie Collins, MD

## 2018-05-23 LAB — CERVICOVAGINAL ANCILLARY ONLY
Bacterial vaginitis: NEGATIVE
CHLAMYDIA, DNA PROBE: NEGATIVE
Candida vaginitis: POSITIVE — AB
NEISSERIA GONORRHEA: NEGATIVE
Trichomonas: NEGATIVE

## 2018-05-24 ENCOUNTER — Encounter: Payer: Medicaid Other | Admitting: Obstetrics and Gynecology

## 2018-05-24 DIAGNOSIS — B379 Candidiasis, unspecified: Secondary | ICD-10-CM

## 2018-05-24 MED ORDER — FLUCONAZOLE 150 MG PO TABS: 150.0000 mg | ORAL_TABLET | Freq: Once | ORAL | 0 refills | Status: DC

## 2018-05-25 NOTE — Telephone Encounter (Signed)
Thank you, Michelle!

## 2018-06-01 ENCOUNTER — Institutional Professional Consult (permissible substitution): Payer: Medicaid Other | Admitting: Physician Assistant

## 2018-06-15 ENCOUNTER — Telehealth: Payer: Self-pay | Admitting: *Deleted

## 2018-06-15 NOTE — Telephone Encounter (Signed)
Patient called reporting nausea, vomiting and diarrhea. Pt think she has a virus she contracted from your job. Pt reported vomiting up acid, gray in color. She is unable to keep anything down. Offered to send n/v medication in for patient, pt declined. Advised patient to go to MAU for further evaluation. Advised patient that there was no provider in clinic today and there is only nurse in clinic. Pt stated she was going to MAU.  Clovis PuMartin, Raejean Swinford L, RN

## 2018-06-20 ENCOUNTER — Ambulatory Visit (INDEPENDENT_AMBULATORY_CARE_PROVIDER_SITE_OTHER): Payer: Medicaid Other | Admitting: Nurse Practitioner

## 2018-06-20 ENCOUNTER — Encounter: Payer: Self-pay | Admitting: General Practice

## 2018-06-20 ENCOUNTER — Other Ambulatory Visit (HOSPITAL_COMMUNITY)
Admission: RE | Admit: 2018-06-20 | Discharge: 2018-06-20 | Disposition: A | Discharge status: Home / Self Care | Payer: Medicaid Other | Source: Ambulatory Visit | Attending: Nurse Practitioner | Admitting: Nurse Practitioner

## 2018-06-20 ENCOUNTER — Other Ambulatory Visit: Payer: Medicaid Other | Admitting: *Deleted

## 2018-06-20 VITALS — BP 139/80 | HR 106 | Wt 169.0 lb

## 2018-06-20 DIAGNOSIS — O26852 Spotting complicating pregnancy, second trimester: Secondary | ICD-10-CM

## 2018-06-20 DIAGNOSIS — Z3402 Encounter for supervision of normal first pregnancy, second trimester: Secondary | ICD-10-CM

## 2018-06-20 DIAGNOSIS — O26859 Spotting complicating pregnancy, unspecified trimester: Secondary | ICD-10-CM

## 2018-06-20 DIAGNOSIS — O26899 Other specified pregnancy related conditions, unspecified trimester: Secondary | ICD-10-CM | POA: Diagnosis present

## 2018-06-20 DIAGNOSIS — Z3400 Encounter for supervision of normal first pregnancy, unspecified trimester: Secondary | ICD-10-CM

## 2018-06-20 DIAGNOSIS — R102 Pelvic and perineal pain: Secondary | ICD-10-CM | POA: Insufficient documentation

## 2018-06-20 DIAGNOSIS — Z23 Encounter for immunization: Secondary | ICD-10-CM

## 2018-06-20 DIAGNOSIS — O26892 Other specified pregnancy related conditions, second trimester: Secondary | ICD-10-CM

## 2018-06-20 LAB — POCT URINALYSIS DIPSTICK OB
Bilirubin, UA: NEGATIVE
Glucose, UA: NEGATIVE
KETONES UA: NEGATIVE
Nitrite, UA: NEGATIVE
PH UA: 7 (ref 5.0–8.0)
SPEC GRAV UA: 1.02 (ref 1.010–1.025)
Urobilinogen, UA: 1 E.U./dL

## 2018-06-20 NOTE — Progress Notes (Signed)
   Patient in clinic for 28 week lab work.  Daeshawn Redmann L, RN  

## 2018-06-20 NOTE — Patient Instructions (Addendum)
Drink at least 8 8-oz glasses of water every day. Read info on [employer] contractions below. Sign up for childbirth classes. No sex until 70 weeks.  Childbirth Education Options: [facility] Department Classes:  Childbirth education classes can help you get ready for a positive parenting experience. You can also meet other expectant parents and get free stuff for your baby. Each class runs for five weeks on the same night and costs $45 for the mother-to-be and her support person. Medicaid covers the cost if you are eligible. Call [phone] to register. [facility] Childbirth Education:  [phone] or [phone] or [email or web address]  Baby & Me Class: Discuss newborn & infant parenting and family adjustment issues with other new mothers in a relaxed environment. Each week brings a new speaker or baby-centered activity. We encourage new mothers to join [place] every Thursday at 11:00am. Babies birth until crawling. No registration or fee. Daddy [employer]: This course offers Dads-to-be the tools and knowledge needed to feel confident on their journey to becoming new fathers. Experienced dads, who have been trained as coaches, teach dads-to-be how to hold, comfort, diaper, swaddle and play with their infant while being able to support the new mom as well. A class for men taught by men. $25/dad Big Brother/Big Sister: Let your children share in the joy of a new brother or sister in this special class designed just for them. Class includes discussion about how families care for babies: swaddling, holding, diapering, safety as well as how they can be helpful in their new role. This class is designed for children ages 38 to 30, but any age is welcome. Please register each child individually. $5/child  Mom Talk: This mom-led group offers support and connection to mothers as they journey through the adjustments and struggles of that sometimes overwhelming first year after the birth of  a child. Tuesdays at 10:00am and Thursdays at 6:00pm. Babies welcome. No registration or fee. Breastfeeding Support Group: This group is a mother-to-mother support circle where moms have the opportunity to share their breastfeeding experiences. A Lactation Consultant is present for questions and concerns. Meets each Tuesday at 11:00am. No fee or registration. Breastfeeding Your Baby: Learn what to expect in the first days of breastfeeding your newborn.  This class will help you feel more confident with the skills needed to begin your breastfeeding experience. Many new mothers are concerned about breastfeeding after leaving the hospital. This class will also address the most common fears and challenges about breastfeeding during the first few weeks, months and beyond. (call for fee) Comfort Techniques and Tour: This 2 hour interactive class will provide you the opportunity to learn & practice hands-on techniques that can help relieve some of the discomfort of labor and encourage your baby to rotate toward the best position for birth. You and your partner will be able to try a variety of labor positions with birth balls and rebozos as well as practice breathing, relaxation, and visualization techniques. A tour of the [facility] is included with this class. $20 per registrant and support person Childbirth Class- Weekend Option: This class is a Weekend version of our Birth & Baby series. It is designed for parents who have a difficult time fitting several weeks of classes into their schedule. It covers the care of your newborn and the basics of labor and childbirth. It also includes a [place] [facility] and lunch. The class is held two consecutive days: beginning on Friday  evening from 6:30 - 8:30 p.m. and the next day, Saturday from 9 a.m. - 4 p.m. (call for fee) Waterbirth Class: Interested in a waterbirth?  This informational class will help you discover  whether waterbirth is the right fit for you. Education about waterbirth itself, supplies you would need and how to assemble your support team is what you can expect from this class. Some obstetrical practices require this class in order to pursue a waterbirth. (Not all obstetrical practices offer waterbirth-check with your healthcare provider.) Register only the expectant mom, but you are encouraged to bring your partner to class! Required if planning waterbirth, no fee. Infant/Child CPR: Parents, grandparents, babysitters, and friends learn Cardio-Pulmonary Resuscitation skills for infants and children. You will also learn how to treat both conscious and unconscious choking in infants and children. This Family & Friends program does not offer certification. Register each participant individually to ensure that enough mannequins are available. (Call for fee) Grandparent Love: Expecting a grandbaby? This class is for you! Learn about the latest infant care and safety recommendations and ways to support your own child as he or she transitions into the parenting role. Taught by Registered Nurses who are childbirth instructors, but most importantly...they are grandmothers too! $10/person. Childbirth Class- Natural Childbirth: This series of 5 weekly classes is for expectant parents who want to learn and practice natural methods of coping with the process of labor and childbirth. Relaxation, breathing, massage, visualization, role of the partner, and helpful positioning are highlighted. Participants learn how to be confident in their body's ability to give birth. This class will empower and help parents make informed decisions about their own care. Includes discussion that will help new parents transition into the immediate postpartum period. [facility] is included. We suggest taking this class between 25-32 weeks, but it's only a recommendation. $75 per registrant and one support  person or $30 Medicaid. Childbirth Class- 3 week Series: This option of 3 weekly classes helps you and your labor partner prepare for childbirth. Newborn care, labor & birth, cesarean birth, pain management, and comfort techniques are discussed and a [place] [facility] is included. The class meets at the same time, on the same day of the week for 3 consecutive weeks beginning with the starting date you choose. $60 for registrant and one support person.  Marvelous Multiples: Expecting twins, triplets, or more? This class covers the differences in labor, birth, parenting, and breastfeeding issues that face multiples' parents. NICU tour is included. Led by a Certified Childbirth Educator who is the mother of twins. No fee. Caring for Baby: This class is for expectant and adoptive parents who want to learn and practice the most up-to-date newborn care for their babies. Focus is on birth through the first six weeks of life. Topics include feeding, bathing, diapering, crying, umbilical cord care, circumcision care and safe sleep. Parents learn to recognize symptoms of illness and when to call the pediatrician. Register only the mom-to-be and your partner or support person can plan to come with you! $10 per registrant and support person Childbirth Class- online option: This online class offers you the freedom to complete a Birth and Baby series in the comfort of your own home. The flexibility of this option allows you to review sections at your own pace, at times convenient to you and your support people. It includes additional video information, animations, quizzes, and extended activities. Get organized with helpful eClass tools, checklists, and  trackers. Once you register online for the class, you will receive an email within a few days to accept the invitation and begin the class when the time is right for you. The content will be available to you for 60 days. $60 for 60 days of online  access for you and your support people.  Local Doulas: Natural Baby Doulas [email or web address] Tel: [phone] [email or web address] [employer] [phone] [email or web address] The Labor Hassell Halim  (also do waterbirth tub rental) [phone] [email or web address] [email or web address] Birth Doula [phone] [email or web address].fi [facility] Rhythms  [phone] [email or web address] [employer] (PADA) [email or web address] [place] [facility]  [email or web address] Considering Waterbirth? Guide for patients at [facility] [employer]  Why consider waterbirth?  . Gentle birth for babies . Less pain medicine used in labor . May allow for passive descent/less pushing . May reduce perineal tears  . More mobility and instinctive maternal position changes . Increased maternal relaxation . Reduced blood pressure in labor  Is waterbirth safe? What are the risks of infection, drowning or other complications?  . Infection: o Very low risk (3.7 % for tub vs 4.8% for bed) o 7 in 8000 waterbirths with documented infection o Poorly cleaned equipment most common cause o Slightly lower group B strep transmission rate  . Drowning o Maternal:  - Very low risk   - Related to seizures or fainting o Newborn:  - Very low risk. No evidence of increased risk of respiratory problems in multiple large studies - Physiological protection from breathing under water - Avoid underwater birth if there are any fetal complications - Once baby's head is out of the water, keep it out.  . Birth complication o Some reports of cord trauma, but risk decreased by bringing baby to surface gradually o No evidence of increased risk of shoulder dystocia. Mothers can usually change positions faster in water than  in a bed, possibly aiding the maneuvers to free the shoulder.   You must attend a Doren Custard class at [facility]  3rd Wednesday of every month from 7-9pm  [employer] by calling [phone] or online at [email or web address]  Bring [place] the certificate from the class to your prenatal appointment  Meet with a midwife at 36 weeks to see if you can still plan a waterbirth and to sign the consent.   Purchase or rent the following supplies:   Water Birth Pool (Birth Pool in a Box or [place] for instance)  (Tubs start ~$125)  Single-use disposable tub liner designed for your brand of tub  New garden hose labeled "lead-free", "suitable for drinking water",  Electric drain pump to remove water (We recommend 792 gallon per hour or greater pump.)   Separate garden hose to remove the dirty water  Fish net  Bathing suit top (optional)  Long-handled mirror (optional)  Places to purchase or rent supplies  [email or web address] for tub purchases and supplies  Waterbirthsolutions.com for tub purchases and supplies  The Labor Ladies ([email or web address]) $275 for tub rental/set-up & take down/kit   [employer] (http://www.fleming.[email or web address]) Information regarding doulas (labor support) who provide pool rentals  Our practice has a Birth Pool in a Box tub at the hospital that you may borrow on a first-come-first-served basis. It is your responsibility to to set up, clean and break down the tub. We cannot guarantee the availability of this tub in advance. You are responsible for bringing all accessories listed above. If you do not have all necessary supplies you cannot have a waterbirth.  Things that would prevent you from having a waterbirth:  Premature, <37wks  Previous cesarean birth  Presence of thick meconium-stained fluid  Multiple gestation (Twins, triplets, etc.)  Uncontrolled diabetes or gestational diabetes requiring  medication  Hypertension requiring medication or diagnosis of pre-eclampsia  Heavy vaginal bleeding  Non-reassuring fetal heart rate  Active infection (MRSA, etc.). Group B Strep is NOT a contraindication for  waterbirth.  If your labor has to be induced and induction method requires continuous  monitoring of the baby's heart rate  Other risks/issues identified by your obstetrical provider  Please remember that birth is unpredictable. Under certain unforeseeable circumstances your provider may advise against giving birth in the tub. These decisions will be made on a case-by-case basis and with the safety of you and your baby as our highest priority.        Braxton Hicks Contractions Contractions of the uterus can occur throughout pregnancy, but they are not always a sign that you are in labor. You may have practice contractions called Braxton Hicks contractions. These false labor contractions are sometimes confused with true labor. What are [place] contractions? Braxton Hicks contractions are tightening movements that occur in the muscles of the uterus before labor. Unlike true labor contractions, these contractions do not result in opening (dilation) and thinning of the cervix. Toward the end of pregnancy (32-34 weeks), Braxton Hicks contractions can happen more often and may become stronger. These contractions are sometimes difficult to tell apart from true labor because they can be very uncomfortable. You should not feel embarrassed if you go to the hospital with false labor. Sometimes, the only way to tell if you are in true labor is for your health care provider to look for changes in the cervix. The health care provider will do a physical exam and may monitor your contractions. If you are not in true labor, the exam should show that your cervix is not dilating and your water has not broken. If there are other health problems associated with your pregnancy, it is completely safe  for you to be sent home with false labor. You may continue to have Braxton Hicks contractions until you go into true labor. How to tell the difference between true labor and false labor True labor  Contractions last 30-70 seconds.  Contractions become very regular.  Discomfort is usually felt in the top of the uterus, and it spreads to the lower abdomen and low back.  Contractions do not go away with walking.  Contractions usually become more intense and increase in frequency.  The cervix dilates and gets thinner. False labor  Contractions are usually shorter and not as strong as true labor contractions.  Contractions are usually irregular.  Contractions are often felt in the front of the lower abdomen and in the groin.  Contractions may go away when you walk around or change positions while lying down.  Contractions get weaker and are shorter-lasting as time goes on.  The cervix usually does not dilate or become thin. Follow these instructions at home:  Take over-the-counter and prescription medicines only as told by your health care provider.  Keep up with your usual exercises and follow other instructions from your health care provider.  Eat and drink lightly if you think you are going into labor.  If Braxton Hicks contractions are making you uncomfortable: ? Change your position from lying down or resting to walking, or change from walking to resting. ? Sit and rest in a tub of warm  water. ? Drink enough fluid to keep your urine pale yellow. Dehydration may cause these contractions. ? Do slow and deep breathing several times an hour.  Keep all follow-up prenatal visits as told by your health care provider. This is important. Contact a health care provider if:  You have a fever.  You have continuous pain in your abdomen. Get help right away if:  Your contractions become stronger, more regular, and closer together.  You have fluid leaking or gushing from your  vagina.  You pass blood-tinged mucus (bloody show).  You have bleeding from your vagina.  You have low back pain that you never had before.  You feel your baby's head pushing down and causing pelvic pressure.  Your baby is not moving inside you as much as it used to. Summary  Contractions that occur before labor are called Braxton Hicks contractions, false labor, or practice contractions.  Braxton Hicks contractions are usually shorter, weaker, farther apart, and less regular than true labor contractions. True labor contractions usually become progressively stronger and regular and they become more frequent.  Manage discomfort from [facility] contractions by changing position, resting in a warm bath, drinking plenty of water, or practicing deep breathing. This information is not intended to replace advice given to you by your health care provider. Make sure you discuss any questions you have with your health care provider. Document Released: 11/24/2016 Document Revised: 11/24/2016 Document Reviewed: 11/24/2016 Elsevier Interactive Patient Education  2018 [place].  Influenza (Flu) Vaccine (Inactivated or Recombinant): What You Need to Know  Tdap Vaccine (Tetanus, Diphtheria and Pertussis): What You Need to Know 1. Why get vaccinated? Tetanus, diphtheria and pertussis are very serious diseases. Tdap vaccine can protect [place] from these diseases. And, Tdap vaccine given to pregnant women can protect newborn babies against pertussis. TETANUS (Lockjaw) is rare in the [place] today. It causes painful muscle tightening and stiffness, usually all over the body.  It can lead to tightening of muscles in the head and neck so you can't open your mouth, swallow, or sometimes even breathe. Tetanus kills about 1 out of 10 people who are infected even after receiving the best medical care.  DIPHTHERIA is also rare in the [place] today. It can cause a thick coating to form in the back  of the throat.  It can lead to breathing problems, heart failure, paralysis, and death.  PERTUSSIS (Whooping Cough) causes severe coughing spells, which can cause difficulty breathing, vomiting and disturbed sleep.  It can also lead to weight loss, incontinence, and rib fractures. Up to 2 in 100 adolescents and 5 in 100 adults with pertussis are hospitalized or have complications, which could include pneumonia or death.  These diseases are caused by bacteria. Diphtheria and pertussis are spread from person to person through secretions from coughing or sneezing. Tetanus enters the body through cuts, scratches, or wounds. Before vaccines, as many as 200,000 cases of diphtheria, 200,000 cases of pertussis, and hundreds of cases of tetanus, were reported in the [place] each year. Since vaccination began, reports of cases for tetanus and diphtheria have dropped by about 99% and for pertussis by about 80%. 2. Tdap vaccine Tdap vaccine can protect adolescents and adults from tetanus, diphtheria, and pertussis. One dose of Tdap is routinely given at age 74 or 31. People who did not get Tdap at that age should get it as soon as possible. Tdap is especially important for healthcare professionals and anyone having close contact with  a baby younger than 12 months. Pregnant women should get a dose of Tdap during every pregnancy, to protect the newborn from pertussis. Infants are most at risk for severe, life-threatening complications from pertussis. Another vaccine, called Td, protects against tetanus and diphtheria, but not pertussis. A Td booster should be given every 10 years. Tdap may be given as one of these boosters if you have never gotten Tdap before. Tdap may also be given after a severe cut or burn to prevent tetanus infection. Your doctor or the person giving you the vaccine can give you more information. Tdap may safely be given at the same time as other vaccines. 3. Some people should not get  this vaccine  A person who has ever had a life-threatening allergic reaction after a previous dose of any diphtheria, tetanus or pertussis containing vaccine, OR has a severe allergy to any part of this vaccine, should not get Tdap vaccine. Tell the person giving the vaccine about any severe allergies.  Anyone who had coma or long repeated seizures within 7 days after a childhood dose of DTP or DTaP, or a previous dose of Tdap, should not get Tdap, unless a cause other than the vaccine was found. They can still get Td.  Talk to your doctor if you: ? have seizures or another nervous system problem, ? had severe pain or swelling after any vaccine containing diphtheria, tetanus or pertussis, ? ever had a condition called Guillain-Barr Syndrome (GBS), ? aren't feeling well on the day the shot is scheduled. 4. Risks With any medicine, including vaccines, there is a chance of side effects. These are usually mild and go away on their own. Serious reactions are also possible but are rare. Most people who get Tdap vaccine do not have any problems with it. Mild problems following Tdap: (Did not interfere with activities)  Pain where the shot was given (about 3 in 4 adolescents or 2 in 3 adults)  Redness or swelling where the shot was given (about 1 person in 5)  Mild fever of at least 100.21F (up to about 1 in 25 adolescents or 1 in 100 adults)  Headache (about 3 or 4 people in 10)  Tiredness (about 1 person in 3 or 4)  Nausea, vomiting, diarrhea, stomach ache (up to 1 in 4 adolescents or 1 in 10 adults)  Chills, sore joints (about 1 person in 10)  Body aches (about 1 person in 3 or 4)  Rash, swollen glands (uncommon)  Moderate problems following Tdap: (Interfered with activities, but did not require medical attention)  Pain where the shot was given (up to 1 in 5 or 6)  Redness or swelling where the shot was given (up to about 1 in 16 adolescents or 1 in 12 adults)  Fever over 102F  (about 1 in 100 adolescents or 1 in 250 adults)  Headache (about 1 in 7 adolescents or 1 in 10 adults)  Nausea, vomiting, diarrhea, stomach ache (up to 1 or 3 people in 100)  Swelling of the entire arm where the shot was given (up to about 1 in 500).  Severe problems following Tdap: (Unable to perform usual activities; required medical attention)  Swelling, severe pain, bleeding and redness in the arm where the shot was given (rare).  Problems that could happen after any vaccine:  People sometimes faint after a medical procedure, including vaccination. Sitting or lying down for about 15 minutes can help prevent fainting, and injuries caused by a fall. Tell your  doctor if you feel dizzy, or have vision changes or ringing in the ears.  Some people get severe pain in the shoulder and have difficulty moving the arm where a shot was given. This happens very rarely.  Any medication can cause a severe allergic reaction. Such reactions from a vaccine are very rare, estimated at fewer than 1 in a million doses, and would happen within a few minutes to a few hours after the vaccination. As with any medicine, there is a very remote chance of a vaccine causing a serious injury or death. The safety of vaccines is always being monitored. For more information, visit: http://www.aguilar.org/ 5. What if there is a serious problem? What should I look for? Look for anything that concerns you, such as signs of a severe allergic reaction, very high fever, or unusual behavior. Signs of a severe allergic reaction can include hives, swelling of the face and throat, difficulty breathing, a fast heartbeat, dizziness, and weakness. These would usually start a few minutes to a few hours after the vaccination. What should I do?  If you think it is a severe allergic reaction or other emergency that can't wait, call 9-1-1 or get the person to the nearest hospital. Otherwise, call your doctor.  Afterward, the  reaction should be reported to the Vaccine Adverse Event Reporting System (VAERS). Your doctor might file this report, or you can do it yourself through the VAERS web site at www.vaers.SamedayNews.es, or by calling 971-050-0574. ? VAERS does not give medical advice. 6. The National Vaccine Injury Compensation Program The [employer] Vaccine Injury Compensation Program (VICP) is a federal program that was created to compensate people who may have been injured by certain vaccines. Persons who believe they may have been injured by a vaccine can learn about the program and about filing a claim by calling [phone] or visiting the [place] website at GoldCloset.com.ee. There is a time limit to file a claim for compensation. 7. How can I learn more?  Ask your doctor. He or she can give you the vaccine package insert or suggest other sources of information.  Call your local or state health department.  Contact the Centers for Disease Control and Prevention (CDC): ? Call 249-588-0480 (1-800-CDC-INFO) or ? Visit CDC's website at http://hunter.com/ CDC Tdap Vaccine VIS (09/17/13) This information is not intended to replace advice given to you by your health care provider. Make sure you discuss any questions you have with your health care provider. Document Released: 01/10/2012 Document Revised: 03/31/2016 Document Reviewed: 03/31/2016 Elsevier Interactive Patient Education  2017 [place].  1. Why get vaccinated? Influenza ("flu") is a contagious disease that spreads around the [place] every year, usually between October and May. Flu is caused by influenza viruses, and is spread mainly by coughing, sneezing, and close contact. Anyone can get flu. Flu strikes suddenly and can last several days. Symptoms vary by age, but can include:  fever/chills  sore throat  muscle aches  fatigue  cough  headache  runny or stuffy nose  Flu can also lead to pneumonia and blood  infections, and cause diarrhea and seizures in children. If you have a medical condition, such as heart or lung disease, flu can make it worse. Flu is more dangerous for some people. Infants and young children, people 74 years of age and older, pregnant women, and people with certain health conditions or a weakened immune system are at greatest risk. Each year thousands of people in the [place] die from flu,  and many more are hospitalized. Flu vaccine can:  keep you from getting flu,  make flu less severe if you do get it, and  keep you from spreading flu to your family and other people. 2. Inactivated and recombinant flu vaccines A dose of flu vaccine is recommended every flu season. Children 6 months through 89 years of age may need two doses during the same flu season. Everyone else needs only one dose each flu season. Some inactivated flu vaccines contain a very small amount of a mercury-based preservative called thimerosal. Studies have not shown thimerosal in vaccines to be harmful, but flu vaccines that do not contain thimerosal are available. There is no live flu virus in flu shots. They cannot cause the flu. There are many flu viruses, and they are always changing. Each year a new flu vaccine is made to protect against three or four viruses that are likely to cause disease in the upcoming flu season. But even when the vaccine doesn't exactly match these viruses, it may still provide some protection. Flu vaccine cannot prevent:  flu that is caused by a virus not covered by the vaccine, or  illnesses that look like flu but are not.  It takes about 2 weeks for protection to develop after vaccination, and protection lasts through the flu season. 3. Some people should not get this vaccine Tell the person who is giving you the vaccine:  If you have any severe, life-threatening allergies. If you ever had a life-threatening allergic reaction after a dose of flu vaccine, or have a  severe allergy to any part of this vaccine, you may be advised not to get vaccinated. Most, but not all, types of flu vaccine contain a small amount of egg protein.  If you ever had Guillain-Barr Syndrome (also called GBS). Some people with a history of GBS should not get this vaccine. This should be discussed with your doctor.  If you are not feeling well. It is usually okay to get flu vaccine when you have a mild illness, but you might be asked to come back when you feel better.  4. Risks of a vaccine reaction With any medicine, including vaccines, there is a chance of reactions. These are usually mild and go away on their own, but serious reactions are also possible. Most people who get a flu shot do not have any problems with it. Minor problems following a flu shot include:  soreness, redness, or swelling where the shot was given  hoarseness  sore, red or itchy eyes  cough  fever  aches  headache  itching  fatigue  If these problems occur, they usually begin soon after the shot and last 1 or 2 days. More serious problems following a flu shot can include the following:  There may be a small increased risk of Guillain-Barre Syndrome (GBS) after inactivated flu vaccine. This risk has been estimated at 1 or 2 additional cases per million people vaccinated. This is much lower than the risk of severe complications from flu, which can be prevented by flu vaccine.  Young children who get the flu shot along with pneumococcal vaccine (PCV13) and/or DTaP vaccine at the same time might be slightly more likely to have a seizure caused by fever. Ask your doctor for more information. Tell your doctor if a child who is getting flu vaccine has ever had a seizure.  Problems that could happen after any injected vaccine:  People sometimes faint after a medical procedure, including vaccination. Sitting  or lying down for about 15 minutes can help prevent fainting, and injuries caused by a fall.  Tell your doctor if you feel dizzy, or have vision changes or ringing in the ears.  Some people get severe pain in the shoulder and have difficulty moving the arm where a shot was given. This happens very rarely.  Any medication can cause a severe allergic reaction. Such reactions from a vaccine are very rare, estimated at about 1 in a million doses, and would happen within a few minutes to a few hours after the vaccination. As with any medicine, there is a very remote chance of a vaccine causing a serious injury or death. The safety of vaccines is always being monitored. For more information, visit: http://www.aguilar.org/ 5. What if there is a serious reaction? What should I look for? Look for anything that concerns you, such as signs of a severe allergic reaction, very high fever, or unusual behavior. Signs of a severe allergic reaction can include hives, swelling of the face and throat, difficulty breathing, a fast heartbeat, dizziness, and weakness. These would start a few minutes to a few hours after the vaccination. What should I do?  If you think it is a severe allergic reaction or other emergency that can't wait, call 9-1-1 and get the person to the nearest hospital. Otherwise, call your doctor.  Reactions should be reported to the Vaccine Adverse Event Reporting System (VAERS). Your doctor should file this report, or you can do it yourself through the VAERS web site at www.vaers.SamedayNews.es, or by calling 534-122-9030. ? VAERS does not give medical advice. 6. The National Vaccine Injury Compensation Program The [employer] Vaccine Injury Compensation Program (VICP) is a federal program that was created to compensate people who may have been injured by certain vaccines. Persons who believe they may have been injured by a vaccine can learn about the program and about filing a claim by calling 289-554-6785 or visiting the [place] website at GoldCloset.com.ee. There is a time  limit to file a claim for compensation. 7. How can I learn more?  Ask your healthcare provider. He or she can give you the vaccine package insert or suggest other sources of information.  Call your local or state health department.  Contact the Centers for Disease Control and Prevention (CDC): ? Call 239-287-0126 (1-800-CDC-INFO) or ? Visit CDC's website at https://gibson.com/ Vaccine Information Statement, Inactivated Influenza Vaccine (02/28/2014) This information is not intended to replace advice given to you by your health care provider. Make sure you discuss any questions you have with your health care provider. Document Released: 05/05/2006 Document Revised: 03/31/2016 Document Reviewed: 03/31/2016 Elsevier Interactive Patient Education  2017 [employer].

## 2018-06-20 NOTE — Progress Notes (Addendum)
    Subjective:  Colleen Steele is a 17 y.o. G2P0010 at 1441w3d being seen today for ongoing prenatal care.  She is currently monitored for the following issues for this low-risk pregnancy and has Supervision of normal first pregnancy, antepartum; Nausea/vomiting in pregnancy; Trichomonal infection; Gonorrhea; Chlamydia; and Migraine headache on their problem list.  Patient reports feels well today but had some vaginal bleeding a few days ago after intercourse and is having some pelvic pressure..  Contractions: Not present. Vag. Bleeding: Scant.  Movement: Present. Denies leaking of fluid. Was not aware that baby balling up was really a contraction.  One episode of vaginal bleeding after sex almost a week ago.  No bleeding since.  The following portions of the patient's history were reviewed and updated as appropriate: allergies, current medications, past family history, past medical history, past social history, past surgical history and problem list. Problem list updated.  Objective:   Vitals:   06/20/18 0803  BP: 139/80  Pulse: (!) 106  Weight: 169 lb (76.7 kg)    Fetal Status: Fetal Heart Rate (bpm): 140 Fundal Height: 28 cm Movement: Present  Presentation: Vertex  General:  Alert, oriented and cooperative. Patient is in no acute distress.  Skin: Skin is warm and dry. No rash noted.   Cardiovascular: Normal heart rate noted  Respiratory: Normal respiratory effort, no problems with respiration noted  Abdomen: Soft, gravid, appropriate for gestational age. Pain/Pressure: Present     Pelvic:  Cervical exam performed Dilation: Fingertip   Station: -3  Lower uterine segment thinning, cervix friable with swab  Extremities: Normal range of motion.  Edema: None  Mental Status: Normal mood and affect. Normal behavior. Normal judgment and thought content.   Urinalysis:      Assessment and Plan:  Pregnancy: G2P0010 at 4441w3d  1. Supervision of normal first pregnancy, antepartum Based on  cervical exam - client is at risk for preterm labor - discussed with client and advised no sex until 35 weeks.  She and her partner were tearful.  Will see again in one week for reevaluation.  Advised to come into the office for contractions more than 4 in one hour or go to MAU for evaluation.  Reviewed drinking water at home and resting to see if contractions would resolve but if not to go to MAU for evaluation.  Client reports no contractions today.  2. Pelvic pressure in pregnancy Urine dipstick is small blood, SG 1.020, small protein, neg nitrites - not confirmatory for UTI - will get urine culture.  Drink at least 8 8-oz glasses of water every day.   - Culture, OB Urine - POC Urinalysis Dipstick OB - Cervicovaginal ancillary only( [place]) - Culture, beta strep (group b only)  3. Spotting affecting pregnancy, antepartum  - Culture, OB Urine - POC Urinalysis Dipstick OB - Cervicovaginal ancillary only( [place]) - Culture, beta strep (group b only)  Preterm labor symptoms and general obstetric precautions including but not limited to vaginal bleeding, contractions, leaking of fluid and fetal movement were reviewed in detail with the patient. Please refer to After Visit Summary for other counseling recommendations.  Return in about 1 week (around 06/27/2018).  Nolene BernheimERRI Kylei Purington, RN, MSN, NP-BC Nurse Practitioner, [facility], [facility] 06/20/2018 8:50 AM

## 2018-06-21 LAB — CBC
HEMOGLOBIN: 10.7 g/dL — AB (ref 11.1–15.9)
Hematocrit: 30.8 % — ABNORMAL LOW (ref 34.0–46.6)
MCH: 29.8 pg (ref 26.6–33.0)
MCHC: 34.7 g/dL (ref 31.5–35.7)
MCV: 86 fL (ref 79–97)
Platelets: 291 10*3/uL (ref 150–450)
RBC: 3.59 x10E6/uL — ABNORMAL LOW (ref 3.77–5.28)
RDW: 12.1 % — AB (ref 12.3–15.4)
WBC: 10.6 10*3/uL (ref 3.4–10.8)

## 2018-06-21 LAB — RPR: RPR: NONREACTIVE

## 2018-06-21 LAB — GLUCOSE TOLERANCE, 2 HOURS W/ 1HR
Glucose, 1 hour: 162 mg/dL (ref 65–179)
Glucose, 2 hour: 106 mg/dL (ref 65–152)
Glucose, Fasting: 75 mg/dL (ref 65–91)

## 2018-06-21 LAB — HIV ANTIBODY (ROUTINE TESTING W REFLEX): HIV SCREEN 4TH GENERATION: NONREACTIVE

## 2018-06-22 LAB — CERVICOVAGINAL ANCILLARY ONLY
Bacterial vaginitis: NEGATIVE
CANDIDA VAGINITIS: POSITIVE — AB
CHLAMYDIA, DNA PROBE: NEGATIVE
Neisseria Gonorrhea: NEGATIVE
Trichomonas: NEGATIVE

## 2018-06-22 LAB — CULTURE, OB URINE

## 2018-06-22 LAB — URINE CULTURE, OB REFLEX

## 2018-06-23 ENCOUNTER — Encounter: Payer: Self-pay | Admitting: Nurse Practitioner

## 2018-06-23 DIAGNOSIS — O99820 Streptococcus B carrier state complicating pregnancy: Secondary | ICD-10-CM | POA: Insufficient documentation

## 2018-06-23 LAB — CULTURE, BETA STREP (GROUP B ONLY): Strep Gp B Culture: POSITIVE — AB

## 2018-06-23 MED ORDER — TERCONAZOLE 0.4 % VA CREA: 1.0000 | TOPICAL_CREAM | Freq: Every day | VAGINAL | 0 refills | Status: DC

## 2018-06-23 NOTE — Addendum Note (Signed)
Addended by: Currie ParisBURLESON, Icel Castles L on: 06/23/2018 08:05 PM   Modules accepted: Orders

## 2018-06-23 NOTE — Progress Notes (Signed)
Yeast infection noted on labs.  Sent Terazol cream to her pharmacy and sent her a MyChart note.  Additionally had + GBS - sent note to clinical staff to discuss with her at her next ROB visit.  Nolene BernheimERRI Garth Diffley, RN, MSN, NP-BC Nurse Practitioner, [facility], [facility] 06/23/2018 8:04 PM

## 2018-06-25 ENCOUNTER — Telehealth: Payer: Self-pay | Admitting: *Deleted

## 2018-06-25 NOTE — Telephone Encounter (Signed)
-----   Message from Currie Pariserri L Burleson, NP sent at 06/23/2018  8:00 PM EST ----- Please notify patient at visit that she is positive GBS.

## 2018-06-27 ENCOUNTER — Encounter: Payer: Self-pay | Admitting: Obstetrics and Gynecology

## 2018-06-27 ENCOUNTER — Ambulatory Visit (INDEPENDENT_AMBULATORY_CARE_PROVIDER_SITE_OTHER): Payer: Medicaid Other | Admitting: Obstetrics and Gynecology

## 2018-06-27 VITALS — BP 127/76 | HR 101 | Wt 168.0 lb

## 2018-06-27 DIAGNOSIS — Z3A28 28 weeks gestation of pregnancy: Secondary | ICD-10-CM

## 2018-06-27 DIAGNOSIS — Z3403 Encounter for supervision of normal first pregnancy, third trimester: Secondary | ICD-10-CM

## 2018-06-27 NOTE — Progress Notes (Signed)
   PRENATAL VISIT NOTE  Subjective:  Colleen Steele is a 17 y.o. G2P0010 at 3454w3d being seen today for ongoing prenatal care.  She is currently monitored for the following issues for this low-risk pregnancy and has Supervision of normal first pregnancy, antepartum; Nausea/vomiting in pregnancy; Trichomonal infection; Gonorrhea; Chlamydia; Migraine headache; and Group B streptococcal carriage complicating pregnancy on their problem list.  Patient reports no bleeding, no contractions, no cramping, no leaking and leg soreness and fatigue.  Contractions: Not present. Vag. Bleeding: None.  Movement: Present. Denies leaking of fluid.   The following portions of the patient's history were reviewed and updated as appropriate: allergies, current medications, past family history, past medical history, past social history, past surgical history and problem list. Problem list updated.  Objective:   Vitals:   06/27/18 1052  BP: 127/76  Pulse: (!) 101  Weight: 76.2 kg    Fetal Status: Fetal Heart Rate (bpm): 157 Fundal Height: 28 cm Movement: Present  Presentation: Vertex  General:  Alert, oriented and cooperative. Patient is in no acute distress.  Skin: Skin is warm and dry. No rash noted.   Cardiovascular: Normal heart rate noted  Respiratory: Normal respiratory effort, no problems with respiration noted  Abdomen: Soft, gravid, appropriate for gestational age.  Pain/Pressure: Absent     Pelvic: Cervical exam performed Dilation: Fingertip Effacement (%): 80 Station: Ballotable  Extremities: Normal range of motion.  Edema: None  Mental Status: Normal mood and affect. Normal behavior. Normal judgment and thought content.   Assessment and Plan:  Encounter for supervision of normal first pregnancy in third trimester Pregnancy: G2P0010 at 3854w3d - remain on pelvic rest, discussed ways to minimize lifting at work   Preterm labor symptoms and general obstetric precautions including but not limited  to vaginal bleeding, contractions, leaking of fluid and fetal movement were reviewed in detail with the patient. Please refer to After Visit Summary for other counseling recommendations.  Return in about 2 weeks (around 07/11/2018) for Return OB visit.  Future Appointments  Date Time Provider Department Center  07/12/2018  8:10 AM Raelyn Moraawson, Rol[place], CNM [facility] None  07/26/2018 11:10 AM Raelyn Moraawson, Rolitta, CNM [facility] None  08/09/2018 11:10 AM Raelyn Moraawson, Rolitta, CNM [facility] None  08/23/2018 11:10 AM Raelyn Moraawson, Rolitta, CNM [facility] None    Bernerd LimboJamilla R Annelies Coyt, Student-MidWife

## 2018-06-28 ENCOUNTER — Encounter: Payer: Medicaid Other | Admitting: Obstetrics and Gynecology

## 2018-07-02 MED ORDER — FERROUS SULFATE 325 (65 FE) MG PO TABS: 325.0000 mg | ORAL_TABLET | Freq: Every day | ORAL | 3 refills | Status: DC

## 2018-07-12 ENCOUNTER — Ambulatory Visit (INDEPENDENT_AMBULATORY_CARE_PROVIDER_SITE_OTHER): Payer: Medicaid Other | Admitting: Obstetrics and Gynecology

## 2018-07-12 VITALS — BP 124/76 | HR 97 | Wt 167.2 lb

## 2018-07-12 DIAGNOSIS — Z3400 Encounter for supervision of normal first pregnancy, unspecified trimester: Secondary | ICD-10-CM

## 2018-07-12 DIAGNOSIS — Z3403 Encounter for supervision of normal first pregnancy, third trimester: Secondary | ICD-10-CM

## 2018-07-12 DIAGNOSIS — G43009 Migraine without aura, not intractable, without status migrainosus: Secondary | ICD-10-CM

## 2018-07-12 MED ORDER — COMFORT FIT MATERNITY SUPP SM MISC: 1.0000 [IU] | Freq: Every day | 0 refills | Status: DC | PRN

## 2018-07-12 NOTE — Progress Notes (Addendum)
   PRENATAL VISIT NOTE  Subjective:  Colleen Steele is a 17 y.o. G2P0010 at 3860w4d being seen today for ongoing prenatal care.  She is currently monitored for the following issues for this low-risk pregnancy and has Supervision of normal first pregnancy, antepartum; Nausea/vomiting in pregnancy; Trichomonal infection; Gonorrhea; Chlamydia; Migraine headache; and Group B streptococcal carriage complicating pregnancy on their problem list.  Patient reports backache, daily migraines beginning after work, no bleeding, no contractions, no cramping, and no leaking.  Contractions: Not present. Vag. Bleeding: None.  Movement: Present. Denies leaking of fluid. Legs and lower back ache after work and exercise (walking). 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  The following portions of the patient's history were reviewed and updated as appropriate: allergies, current medications, past family history, past medical history, past social history, past surgical history and problem list. Problem list updated.  Objective:   Vitals:   07/12/18 0834  BP: 124/76  Pulse: 97  Weight: 75.8 kg    Fetal Status: Fetal Heart Rate (bpm): 149 Fundal Height: 30 cm Movement: Present     General:  Alert, oriented and cooperative. Patient is in no acute distress.  Skin: Skin is warm and dry. No rash noted.   Cardiovascular: Normal heart rate noted  Respiratory: Normal respiratory effort, no problems with respiration noted  Abdomen: Soft, gravid, appropriate for gestational age.  Pain/Pressure: Present     Pelvic: Cervical exam deferred        Extremities: Normal range of motion.  Edema: None  Mental Status: Normal mood and affect. Normal behavior. Normal judgment and thought content.   Assessment and Plan:  Pregnancy: G2P0010 at 6860w4d  1. Encounter for supervision of normal first  pregnancy in third trimester - Elastic bandages & supports (Comfort fit maternity supp sm)  2. Migraine without aura and without migrainosus, not intractable - Discussed lifestyle changes to prevent migraines: diet, increased fluid intake and stress relief; patient wants to try these things before taking prescription medications for pain relief  Preterm labor symptoms and general obstetric precautions including but not limited to vaginal bleeding, contractions, leaking of fluid and fetal movement were reviewed in detail with the patient. Please refer to After Visit Summary for other counseling recommendations.    Future Appointments  Date Time Provider Department Center  07/26/2018 11:10 AM Raelyn Moraawson, Rolitta, CNM [facility] None  08/09/2018 11:10 AM Raelyn Moraawson, Rolitta, CNM [facility] None  08/23/2018 11:10 AM Raelyn Moraawson, Rolitta, CNM [facility] None    Bernerd LimboJamilla R Bret Vanessen, Student-MidWife

## 2018-07-25 NOTE — L&D Delivery Note (Addendum)
Delivery Note Colleen Steele is a 18 y.o. female G2P0010 with IUP at 7860w3d admitted for SOL.  She had been having contractions since yesterday, and got pushy at home. SROM happened in the car on the way here, and she arrived involuntarily pushing. She pushed through 1 contractions and at 5:28 PM a viable female was delivered via  (Presentation: cephalic, OA with nuchal cord and compound hand).  APGAR: 9, 9; weight pending.  Cord clamping delayed by several minutes then clamped and cut by CNM (with mother's permission).   Placenta and cord status: spontaneous, intact, 3-vessel. She had small bilateral, hemostatic labial tears, and a small superficial tear to the left vaginal wall. No sutures were required. She initially requested a PP IUD, but since she delivered precipitously and without medication, she has changed her preference to the PP Nexplanon. BP readings immediately following delivery were elevated, PEC labs ordered.   Mom to postpartum.  Baby to Couplet care / Skin to Skin.  Bernerd LimboJamilla R Adaiah Jaskot, SNM 09/05/2018, 6:09 PM  Midwife attestation: I was gloved and present for delivery in its entirety and I agree with the above student's note.  Donette LarryMelanie Bhambri, CNM 6:33 PM

## 2018-07-26 ENCOUNTER — Ambulatory Visit (INDEPENDENT_AMBULATORY_CARE_PROVIDER_SITE_OTHER): Payer: Medicaid Other | Admitting: Obstetrics and Gynecology

## 2018-07-26 VITALS — BP 129/88 | HR 108 | Wt 169.2 lb

## 2018-07-26 DIAGNOSIS — R519 Headache, unspecified: Secondary | ICD-10-CM

## 2018-07-26 DIAGNOSIS — R51 Headache: Secondary | ICD-10-CM

## 2018-07-26 DIAGNOSIS — O26893 Other specified pregnancy related conditions, third trimester: Secondary | ICD-10-CM

## 2018-07-26 DIAGNOSIS — L309 Dermatitis, unspecified: Secondary | ICD-10-CM

## 2018-07-26 DIAGNOSIS — O99713 Diseases of the skin and subcutaneous tissue complicating pregnancy, third trimester: Secondary | ICD-10-CM

## 2018-07-26 DIAGNOSIS — Z3483 Encounter for supervision of other normal pregnancy, third trimester: Secondary | ICD-10-CM

## 2018-07-26 MED ORDER — CYCLOBENZAPRINE HCL 10 MG PO TABS: 10.0000 mg | ORAL_TABLET | Freq: Three times a day (TID) | ORAL | 0 refills | Status: DC | PRN

## 2018-07-26 NOTE — Progress Notes (Signed)
   PRENATAL VISIT NOTE  Subjective:  Colleen Steele is a 18 y.o. G2P0010 at [redacted]w[redacted]d being seen today for ongoing prenatal care.  She is currently monitored for the following issues for this low-risk pregnancy and has Supervision of normal first pregnancy, antepartum; Nausea/vomiting in pregnancy; Trichomonal infection; Gonorrhea; Chlamydia; Migraine headache; and Group B streptococcal carriage complicating pregnancy on their problem list.  Patient reports headache and flare up of itching and rash that started on her belly (near stretch marks) and spread to both arms.  Contractions: Not present. Vag. Bleeding: None.  Movement: Present. Denies leaking of fluid.   The following portions of the patient's history were reviewed and updated as appropriate: allergies, current medications, past family history, past medical history, past social history, past surgical history and problem list. Problem list updated.  Objective:   Vitals:   07/26/18 1052 07/26/18 1119  BP: 129/88   Pulse: (!) 114 (!) 108  Weight: 76.7 kg     Fetal Status: Fetal Heart Rate (bpm): 152 Fundal Height: 32 cm Movement: Present     General:  Alert, oriented and cooperative. Patient is in no acute distress.  Skin: Skin is warm and dry. No rash noted.   Cardiovascular: Normal heart rate noted  Respiratory: Normal respiratory effort, no problems with respiration noted  Abdomen: Soft, gravid, appropriate for gestational age.  Pain/Pressure: Absent     Pelvic: Cervical exam deferred        Extremities: Normal range of motion.  Edema: None  Mental Status: Normal mood and affect. Normal behavior. Normal judgment and thought content.   Assessment and Plan:  Pregnancy: G2P0010 at [redacted]w[redacted]d  1. Encounter for supervision of other normal pregnancy in third trimester  2. Headache in pregnancy, antepartum, third trimester - cyclobenzaprine (FLEXERIL) 10 MG tablet; Take 1 tablet (10 mg total) by mouth every 8 (eight) hours as needed  for muscle spasms.  Dispense: 30 tablet; Refill: 0  3. Pruritic urticarial papules and plaques of pregnancy, antepartum, third trimester - Gave instructions on ways to relieve itching (oatmeal baths, OTC hydrocortisone cream, shea butter)  Preterm labor symptoms and general obstetric precautions including but not limited to vaginal bleeding, contractions, leaking of fluid and fetal movement were reviewed in detail with the patient. Please refer to After Visit Summary for other counseling recommendations.  Return in about 2 weeks (around 08/09/2018) for ROB.  Future Appointments  Date Time Provider Department Center  08/09/2018 11:10 AM [name], CNM [facility] None  08/23/2018 11:10 AM [name], CNM [facility] None    Bernerd Limbo, Student-MidWife

## 2018-07-27 NOTE — Progress Notes (Signed)
Subjective: Colleen Steele is a G2P0010 at [redacted]w[redacted]d who presents to the [facility] today for ob visit.  She does not have a history of any mental health concerns. She is not currently sexually active. She is currently using no method for birth control. Patient states family as her support system.   BP 129/88   Pulse (!) 108   Wt 169 lb 3.2 oz (76.7 kg)   LMP 11/29/2017 (Approximate) Comment: states she missed June  BMI 31.97 kg/m   Birth Control History:  Depo  MDM Patient counseled on all options for birth control today including LARC. Patient desires PP IUD initiated for birth control.   Assessment:  18 y.o. female considering PP IUD for birth control  Plan: Continued family planning counseling   Gwyndolyn Saxon, [place] 07/27/2018 10:43 AM

## 2018-08-09 ENCOUNTER — Ambulatory Visit (INDEPENDENT_AMBULATORY_CARE_PROVIDER_SITE_OTHER): Payer: Medicaid Other | Admitting: Obstetrics and Gynecology

## 2018-08-09 VITALS — BP 124/82 | HR 97 | Wt 169.2 lb

## 2018-08-09 DIAGNOSIS — Z3480 Encounter for supervision of other normal pregnancy, unspecified trimester: Secondary | ICD-10-CM

## 2018-08-09 NOTE — Progress Notes (Signed)
   PRENATAL VISIT NOTE  Subjective:  Colleen Steele is a 18 y.o. G2P0010 at [redacted]w[redacted]d being seen today for ongoing prenatal care.  She is currently monitored for the following issues for this low-risk pregnancy and has Supervision of normal first pregnancy, antepartum; Nausea/vomiting in pregnancy; Trichomonal infection; Gonorrhea; Chlamydia; Migraine headache; and Group B streptococcal carriage complicating pregnancy on their problem list.  Patient reports no complaints and headaches relieved well with flexeril prescription.  Contractions: Not present. Vag. Bleeding: None, one episode of post-coital spotting.  Movement: Present. Denies leaking of fluid.   The following portions of the patient's history were reviewed and updated as appropriate: allergies, current medications, past family history, past medical history, past social history, past surgical history and problem list. Problem list updated.  Objective:   Vitals:   08/09/18 1102  BP: 124/82  Pulse: 97  Weight: 76.7 kg    Fetal Status: Fetal Heart Rate (bpm): 154   Movement: Present     General:  Alert, oriented and cooperative. Patient is in no acute distress.  Skin: Skin is warm and dry. No rash noted.   Cardiovascular: Normal heart rate noted  Respiratory: Normal respiratory effort, no problems with respiration noted  Abdomen: Soft, gravid, appropriate for gestational age.  Pain/Pressure: Absent     Pelvic: Cervical exam deferred        Extremities: Normal range of motion.  Edema: None  Mental Status: Normal mood and affect. Normal behavior. Normal judgment and thought content.   Assessment and Plan:  Pregnancy: G2P0010 at [redacted]w[redacted]d  1. Supervision of other normal pregnancy, antepartum - Discussed normalcy of mild post-coital spotting  Preterm labor symptoms and general obstetric precautions including but not limited to vaginal bleeding, contractions, leaking of fluid and fetal movement were reviewed in detail with the  patient. Please refer to After Visit Summary for other counseling recommendations.  Return in about 2 weeks (around 08/23/2018) for ROB.  Future Appointments  Date Time Provider Department Center  08/23/2018 11:10 AM [name], CNM [facility] None  08/30/2018 11:10 AM [name], CNM [facility] None  09/06/2018  2:10 PM [name], CNM [facility] None  09/13/2018 10:50 AM [name], CNM [facility] None    Bernerd Limbo, Student-MidWife

## 2018-08-13 ENCOUNTER — Encounter (HOSPITAL_COMMUNITY): Payer: Self-pay | Admitting: *Deleted

## 2018-08-13 ENCOUNTER — Inpatient Hospital Stay (HOSPITAL_COMMUNITY)
Admission: AD | Admit: 2018-08-13 | Discharge: 2018-08-13 | Disposition: A | Discharge status: Home / Self Care | Payer: Medicaid Other | Attending: Obstetrics and Gynecology | Admitting: Obstetrics and Gynecology

## 2018-08-13 ENCOUNTER — Other Ambulatory Visit: Payer: Self-pay

## 2018-08-13 DIAGNOSIS — O219 Vomiting of pregnancy, unspecified: Secondary | ICD-10-CM

## 2018-08-13 DIAGNOSIS — Z0371 Encounter for suspected problem with amniotic cavity and membrane ruled out: Secondary | ICD-10-CM | POA: Diagnosis present

## 2018-08-13 DIAGNOSIS — Z3A35 35 weeks gestation of pregnancy: Secondary | ICD-10-CM | POA: Diagnosis not present

## 2018-08-13 DIAGNOSIS — O4700 False labor before 37 completed weeks of gestation, unspecified trimester: Secondary | ICD-10-CM | POA: Diagnosis present

## 2018-08-13 DIAGNOSIS — O479 False labor, unspecified: Secondary | ICD-10-CM | POA: Diagnosis present

## 2018-08-13 DIAGNOSIS — A549 Gonococcal infection, unspecified: Secondary | ICD-10-CM

## 2018-08-13 DIAGNOSIS — Z3400 Encounter for supervision of normal first pregnancy, unspecified trimester: Secondary | ICD-10-CM

## 2018-08-13 DIAGNOSIS — A599 Trichomoniasis, unspecified: Secondary | ICD-10-CM

## 2018-08-13 DIAGNOSIS — Z87891 Personal history of nicotine dependence: Secondary | ICD-10-CM | POA: Diagnosis not present

## 2018-08-13 DIAGNOSIS — O26893 Other specified pregnancy related conditions, third trimester: Secondary | ICD-10-CM | POA: Diagnosis present

## 2018-08-13 HISTORY — DX: Chlamydial infection, unspecified: A74.9

## 2018-08-13 HISTORY — DX: Gonococcal infection, unspecified: A54.9

## 2018-08-13 LAB — URINALYSIS, ROUTINE W REFLEX MICROSCOPIC
BILIRUBIN URINE: NEGATIVE
Glucose, UA: NEGATIVE mg/dL
Hgb urine dipstick: NEGATIVE
Ketones, ur: NEGATIVE mg/dL
Nitrite: NEGATIVE
PH: 6 (ref 5.0–8.0)
Protein, ur: NEGATIVE mg/dL
SPECIFIC GRAVITY, URINE: 1.019 (ref 1.005–1.030)

## 2018-08-13 LAB — POCT FERN TEST: POCT FERN TEST: NEGATIVE

## 2018-08-13 NOTE — MAU Provider Note (Signed)
History     CSN: [id number]  Arrival date and time: 08/13/18 1244   First Provider Initiated Contact with Patient 08/13/18 1359      Chief Complaint  Patient presents with  . Abdominal Pain  . Rupture of Membranes   HPI  Ms.  Colleen Steele is a 18 y.o. year old 332P0010 female at 7573w1d weeks gestation who presents to MAU reporting she was leaking fluid when she woke up at 1000 this AM. She reports she is still leaking fluid now. She also reports having lower abdominal and vaginal pain/cramping all weekend. She reports she last had SI this morning before the leaking happened. She denies any VB. She reports good (+) FM today.  Past Medical History:  Diagnosis Date  . Asthma   . Chlamydia   . Chronic kidney disease   . Diarrhea   . Environmental allergies   . Gonorrhea   . Vomiting     Past Surgical History:  Procedure Laterality Date  . NO PAST SURGERIES      Family History  Problem Relation Age of Onset  . Diverticulitis Sister   . Hypertension Mother     Social History   Tobacco Use  . Smoking status: Former Games developermoker  . Smokeless tobacco: Never Used  Substance Use Topics  . Alcohol use: No  . Drug use: Not Currently    Types: Marijuana    Comment: Stopped 01/22/18    Allergies: No Known Allergies  Medications Prior to Admission  Medication Sig Dispense Refill Last Dose  . albuterol (PROVENTIL HFA;VENTOLIN HFA) 108 (90 Base) MCG/ACT inhaler Inhale 1-2 puffs into the lungs every 4 (four) hours as needed for wheezing or shortness of breath.   Taking  . cyclobenzaprine (FLEXERIL) 10 MG tablet Take 1 tablet (10 mg total) by mouth every 8 (eight) hours as needed for muscle spasms. 30 tablet 0 Taking  . ferrous sulfate (FERROUSUL) 325 (65 FE) MG tablet Take 1 tablet (325 mg total) by mouth daily with breakfast. 30 tablet 3 Taking  . Prenatal MV-Min-FA-Omega-3 (PRENATAL GUMMIES/DHA & FA) 0.4-32.5 MG CHEW Chew 1 each by mouth daily. 30 tablet 12 Taking    Review  of Systems  Constitutional: Negative.   HENT: Negative.   Eyes: Negative.   Respiratory: Negative.   Cardiovascular: Negative.   Gastrointestinal: Positive for abdominal pain.  Endocrine: Negative.   Genitourinary: Positive for pelvic pain and vaginal discharge (leaking fluid since 1000).  Musculoskeletal: Positive for back pain.  Skin: Negative.   Allergic/Immunologic: Negative.   Neurological: Negative.   Hematological: Negative.   Psychiatric/Behavioral: Negative.    Physical Exam   Blood pressure 131/76, pulse 98, temperature 98.3 F (36.8 C), temperature source Oral, resp. rate 18, height 5\' 2"  (1.575 m), weight 76.9 kg, last menstrual period 11/29/2017, SpO2 99 %.  Physical Exam  Nursing note and vitals reviewed. Constitutional: She is oriented to person, place, and time. She appears well-developed and well-nourished.  HENT:  Head: Normocephalic and atraumatic.  Eyes: Pupils are equal, round, and reactive to light.  Neck: Normal range of motion.  Cardiovascular: Normal rate.  Respiratory: Effort normal.  GI: Soft.  Genitourinary:    Genitourinary Comments: Uterus: gravid, S=D, SE: cervix is smooth, pink, no lesions, small amt of thick, clear, mucoid vaginal d/c -- fern slide obtained, 1.5 cm/70%/vtx/-2   Musculoskeletal: Normal range of motion.  Neurological: She is alert and oriented to person, place, and time.  Skin: Skin is warm and dry.  Psychiatric: She  has a normal mood and affect. Her behavior is normal. Judgment and thought content normal.    MAU Course  Procedures  MDM [facility] Speculum Exam Fern NST - FHR: 140 bpm / moderate variability / accels present / decels absent / TOCO: irregular   Results for orders placed or performed during the hospital encounter of 08/13/18 (from the past 24 hour(s))  Fern Test     Status: None   Collection Time: 08/13/18  2:31 PM  Result Value Ref Range   POCT Fern Test Negative = intact amniotic membranes     Assessment  and Plan  No leakage of amniotic fluid into vagina  - Reassurance given that fern slide was negative and no evidence of amniotic fluid in vagina - Discussed that the fluid she leaked earlier today was seminal fluid from the SI she had earlier today  Preterm contractions  - Information provided on preterm birth and preventing preterm birth  - Advised to return to MAU for >/= 6 painful UC's in 1 hour  - Discharge patient - Keep scheduled appt with [facility] on 08/23/2018 - Patient verbalized an understanding of the plan of care and agrees.     Colleen Mora, MSN, CNM 08/13/2018, 1:59 PM

## 2018-08-13 NOTE — MAU Note (Signed)
When she woke up, she was leaking (around 10).  Clear fluid, still coming. No bleeding. Cramping in lower abd and vagina, pain has been going on all weekend

## 2018-08-22 ENCOUNTER — Encounter (HOSPITAL_COMMUNITY): Payer: Self-pay | Admitting: *Deleted

## 2018-08-22 ENCOUNTER — Inpatient Hospital Stay (HOSPITAL_BASED_OUTPATIENT_CLINIC_OR_DEPARTMENT_OTHER): Payer: Medicaid Other

## 2018-08-22 ENCOUNTER — Inpatient Hospital Stay (HOSPITAL_COMMUNITY)
Admission: AD | Admit: 2018-08-22 | Discharge: 2018-08-22 | Disposition: A | Discharge status: Home / Self Care | Payer: Medicaid Other | Attending: Obstetrics and Gynecology | Admitting: Obstetrics and Gynecology

## 2018-08-22 DIAGNOSIS — O353XX0 Maternal care for (suspected) damage to fetus from viral disease in mother, not applicable or unspecified: Secondary | ICD-10-CM | POA: Diagnosis not present

## 2018-08-22 DIAGNOSIS — Z3A36 36 weeks gestation of pregnancy: Secondary | ICD-10-CM

## 2018-08-22 DIAGNOSIS — Z87891 Personal history of nicotine dependence: Secondary | ICD-10-CM | POA: Diagnosis not present

## 2018-08-22 DIAGNOSIS — R109 Unspecified abdominal pain: Secondary | ICD-10-CM

## 2018-08-22 DIAGNOSIS — N949 Unspecified condition associated with female genital organs and menstrual cycle: Secondary | ICD-10-CM | POA: Diagnosis not present

## 2018-08-22 DIAGNOSIS — O26899 Other specified pregnancy related conditions, unspecified trimester: Secondary | ICD-10-CM

## 2018-08-22 DIAGNOSIS — O26893 Other specified pregnancy related conditions, third trimester: Secondary | ICD-10-CM | POA: Diagnosis not present

## 2018-08-22 LAB — URINALYSIS, ROUTINE W REFLEX MICROSCOPIC
Bilirubin Urine: NEGATIVE
Glucose, UA: NEGATIVE mg/dL
HGB URINE DIPSTICK: NEGATIVE
Ketones, ur: NEGATIVE mg/dL
NITRITE: NEGATIVE
Protein, ur: NEGATIVE mg/dL
Specific Gravity, Urine: 1.015 (ref 1.005–1.030)
pH: 6 (ref 5.0–8.0)

## 2018-08-22 LAB — COMPREHENSIVE METABOLIC PANEL
ALT: 12 U/L (ref 0–44)
AST: 18 U/L (ref 15–41)
Albumin: 3 g/dL — ABNORMAL LOW (ref 3.5–5.0)
Alkaline Phosphatase: 217 U/L — ABNORMAL HIGH (ref 38–126)
Anion gap: 10 (ref 5–15)
BILIRUBIN TOTAL: 0.6 mg/dL (ref 0.3–1.2)
BUN: 6 mg/dL (ref 6–20)
CO2: 20 mmol/L — ABNORMAL LOW (ref 22–32)
Calcium: 8.5 mg/dL — ABNORMAL LOW (ref 8.9–10.3)
Chloride: 104 mmol/L (ref 98–111)
Creatinine, Ser: 0.5 mg/dL (ref 0.44–1.00)
GFR calc Af Amer: >60 mL/min (ref >60)
GFR calc non Af Amer: >60 mL/min (ref >60)
Glucose, Bld: 53 mg/dL — ABNORMAL LOW (ref 70–99)
Potassium: 4 mmol/L (ref 3.5–5.1)
Sodium: 134 mmol/L — ABNORMAL LOW (ref 135–145)
Total Protein: 6.8 g/dL (ref 6.5–8.1)

## 2018-08-22 LAB — CBC WITH DIFFERENTIAL/PLATELET
Basophils Absolute: 0 10*3/uL (ref 0.0–0.1)
Basophils Relative: 0 %
Eosinophils Absolute: 0.1 10*3/uL (ref 0.0–0.5)
Eosinophils Relative: 1 %
HEMATOCRIT: 32.2 % — AB (ref 36.0–46.0)
Hemoglobin: 10.6 g/dL — ABNORMAL LOW (ref 12.0–15.0)
LYMPHS PCT: 19 %
Lymphs Abs: 1.9 10*3/uL (ref 0.7–4.0)
MCH: 28.3 pg (ref 26.0–34.0)
MCHC: 32.9 g/dL (ref 30.0–36.0)
MCV: 85.9 fL (ref 80.0–100.0)
MONO ABS: 0.8 10*3/uL (ref 0.1–1.0)
Monocytes Relative: 8 %
Neutro Abs: 7.5 10*3/uL (ref 1.7–7.7)
Neutrophils Relative %: 72 %
Platelets: 255 10*3/uL (ref 150–400)
RBC: 3.75 MIL/uL — ABNORMAL LOW (ref 3.87–5.11)
RDW: 12.7 % (ref 11.5–15.5)
WBC: 10.3 10*3/uL (ref 4.0–10.5)
nRBC: 0 % (ref 0.0–0.2)

## 2018-08-22 LAB — LIPASE, BLOOD: Lipase: 38 U/L (ref 11–51)

## 2018-08-22 NOTE — MAU Provider Note (Addendum)
MAU HISTORY AND PHYSICAL  Chief Complaint:  Contractions   Colleen Steele is a 18 y.o.  G2P0010  at [redacted]w[redacted]d presenting for Contractions  States that for the past several days has had lower uterine discomfort, at times quite painful. Can happen at rest, is unclear what provokes or improves. Has been passing more flatus than usual, normal bm last night. One episode of vomiting shortly after arriving here (nbnb), no other vomiting. No dysuria or hematuria or mid/upper back pain. No upper abdominal pain. No vaginal bleeding or leakage of fluid. Feeling baby move. No chest pain or difficulty breathing. Tolerating PO.  She is eating box of starch.  Past Medical History:  Diagnosis Date  . Asthma   . Chlamydia   . Chronic kidney disease   . Diarrhea   . Environmental allergies   . Gonorrhea   . Vomiting     Past Surgical History:  Procedure Laterality Date  . NO PAST SURGERIES      Family History  Problem Relation Age of Onset  . Diverticulitis Sister   . Hypertension Mother     Social History   Tobacco Use  . Smoking status: Former Games developer  . Smokeless tobacco: Never Used  Substance Use Topics  . Alcohol use: No  . Drug use: Not Currently    Types: Marijuana    Comment: Stopped 01/22/18    No Known Allergies  Medications Prior to Admission  Medication Sig Dispense Refill Last Dose  . cyclobenzaprine (FLEXERIL) 10 MG tablet Take 1 tablet (10 mg total) by mouth every 8 (eight) hours as needed for muscle spasms. 30 tablet 0 08/21/2018 at Unknown time  . ferrous sulfate (FERROUSUL) 325 (65 FE) MG tablet Take 1 tablet (325 mg total) by mouth daily with breakfast. 30 tablet 3 Past Week at Unknown time  . Prenatal MV-Min-FA-Omega-3 (PRENATAL GUMMIES/DHA & FA) 0.4-32.5 MG CHEW Chew 1 each by mouth daily. 30 tablet 12 08/22/2018 at Unknown time  . albuterol (PROVENTIL HFA;VENTOLIN HFA) 108 (90 Base) MCG/ACT inhaler Inhale 1-2 puffs into the lungs every 4 (four) hours as needed for  wheezing or shortness of breath.   Unknown at Unknown time  . multivitamin (VIT W/EXTRA C) CHEW chewable tablet Chew 1 tablet by mouth daily.   Past Week at Unknown time    Review of Systems - Negative except for what is mentioned in HPI.  Physical Exam  Blood pressure 137/78, pulse (!) 129, temperature (!) 97.3 F (36.3 C), temperature source Oral, resp. rate 18, weight 79.4 kg, last menstrual period 11/29/2017. GENERAL: Well-developed, well-nourished female in mild distress.  LUNGS: Clear to auscultation bilaterally.  HEART: Regular rate and rhythm. ABDOMEN: Soft, gravid, uterus mild ttp throughout, no rebound or guarding.  EXTREMITIES: Nontender, no edema, 2+ distal pulses. GU: sve per RN, 1.5 cm and unchanged on recheck 1 hour later Presentation: cephalic per rn exam FHT:  135/mod/+a/-d Contractions: irritability, occasional contraction   Labs: Results for orders placed or performed during the hospital encounter of 08/22/18 (from the past 24 hour(s))  Urinalysis, Routine w reflex microscopic   Collection Time: 08/22/18  1:43 PM  Result Value Ref Range   Color, Urine YELLOW YELLOW   APPearance HAZY (A) CLEAR   Specific Gravity, Urine 1.015 1.005 - 1.030   pH 6.0 5.0 - 8.0   Glucose, UA NEGATIVE NEGATIVE mg/dL   Hgb urine dipstick NEGATIVE NEGATIVE   Bilirubin Urine NEGATIVE NEGATIVE   Ketones, ur NEGATIVE NEGATIVE mg/dL   Protein, ur  NEGATIVE NEGATIVE mg/dL   Nitrite NEGATIVE NEGATIVE   Leukocytes, UA TRACE (A) NEGATIVE   RBC / HPF 0-5 0 - 5 RBC/hpf   WBC, UA 6-10 0 - 5 WBC/hpf   Bacteria, UA RARE (A) NONE SEEN   Squamous Epithelial / LPF 6-10 0 - 5   Mucus PRESENT   CBC with Differential/Platelet   Collection Time: 08/22/18  1:51 PM  Result Value Ref Range   WBC 10.3 4.0 - 10.5 K/uL   RBC 3.75 (L) 3.87 - 5.11 MIL/uL   Hemoglobin 10.6 (L) 12.0 - 15.0 g/dL   HCT 16.132.2 (L) 09.636.0 - 04.546.0 %   MCV 85.9 80.0 - 100.0 fL   MCH 28.3 26.0 - 34.0 pg   MCHC 32.9 30.0 - 36.0 g/dL    RDW 40.912.7 81.111.5 - 91.415.5 %   Platelets 255 150 - 400 K/uL   nRBC 0.0 0.0 - 0.2 %   Neutrophils Relative % 72 %   Neutro Abs 7.5 1.7 - 7.7 K/uL   Lymphocytes Relative 19 %   Lymphs Abs 1.9 0.7 - 4.0 K/uL   Monocytes Relative 8 %   Monocytes Absolute 0.8 0.1 - 1.0 K/uL   Eosinophils Relative 1 %   Eosinophils Absolute 0.1 0.0 - 0.5 K/uL   Basophils Relative 0 %   Basophils Absolute 0.0 0.0 - 0.1 K/uL  Comprehensive metabolic panel   Collection Time: 08/22/18  1:51 PM  Result Value Ref Range   Sodium 134 (L) 135 - 145 mmol/L   Potassium 4.0 3.5 - 5.1 mmol/L   Chloride 104 98 - 111 mmol/L   CO2 20 (L) 22 - 32 mmol/L   Glucose, Bld 53 (L) 70 - 99 mg/dL   BUN 6 6 - 20 mg/dL   Creatinine, Ser 7.820.50 0.44 - 1.00 mg/dL   Calcium 8.5 (L) 8.9 - 10.3 mg/dL   Total Protein 6.8 6.5 - 8.1 g/dL   Albumin 3.0 (L) 3.5 - 5.0 g/dL   AST 18 15 - 41 U/L   ALT 12 0 - 44 U/L   Alkaline Phosphatase 217 (H) 38 - 126 U/L   Total Bilirubin 0.6 0.3 - 1.2 mg/dL   GFR calc non Af Amer >60 >60 mL/min   GFR calc Af Amer >60 >60 mL/min   Anion gap 10 5 - 15    Imaging Studies:    Assessment: Colleen Steele is  18 y.o. G2P0010 at 314w3d presents with contractions. Not in labor, after walking, developed uterine irritability and firm uterus. Sent for u/s. No bleeding, no evidence of abruption. Labs are ok.  Plan: Discharge home Feels hungry Return with significant bleeding, or increasing pain  Stop eating starch Labor precautions  Reva Boresanya S Audric Venn 1/29/20201:33 PM

## 2018-08-22 NOTE — Discharge Instructions (Signed)
Braxton Hicks Contractions Contractions of the uterus can occur throughout pregnancy, but they are not always a sign that you are in labor. You may have practice contractions called Braxton Hicks contractions. These false labor contractions are sometimes confused with true labor. What are Braxton Hicks contractions? Braxton Hicks contractions are tightening movements that occur in the muscles of the uterus before labor. Unlike true labor contractions, these contractions do not result in opening (dilation) and thinning of the cervix. Toward the end of pregnancy (32-34 weeks), Braxton Hicks contractions can happen more often and may become stronger. These contractions are sometimes difficult to tell apart from true labor because they can be very uncomfortable. You should not feel embarrassed if you go to the hospital with false labor. Sometimes, the only way to tell if you are in true labor is for your health care provider to look for changes in the cervix. The health care provider will do a physical exam and may monitor your contractions. If you are not in true labor, the exam should show that your cervix is not dilating and your water has not broken. If there are no other health problems associated with your pregnancy, it is completely safe for you to be sent home with false labor. You may continue to have Braxton Hicks contractions until you go into true labor. How to tell the difference between true labor and false labor True labor  Contractions last 30-70 seconds.  Contractions become very regular.  Discomfort is usually felt in the top of the uterus, and it spreads to the lower abdomen and low back.  Contractions do not go away with walking.  Contractions usually become more intense and increase in frequency.  The cervix dilates and gets thinner. False labor  Contractions are usually shorter and not as strong as true labor contractions.  Contractions are usually irregular.  Contractions  are often felt in the front of the lower abdomen and in the groin.  Contractions may go away when you walk around or change positions while lying down.  Contractions get weaker and are shorter-lasting as time goes on.  The cervix usually does not dilate or become thin. Follow these instructions at home:   Take over-the-counter and prescription medicines only as told by your health care provider.  Keep up with your usual exercises and follow other instructions from your health care provider.  Eat and drink lightly if you think you are going into labor.  If Braxton Hicks contractions are making you uncomfortable: ? Change your position from lying down or resting to walking, or change from walking to resting. ? Sit and rest in a tub of warm water. ? Drink enough fluid to keep your urine pale yellow. Dehydration may cause these contractions. ? Do slow and deep breathing several times an hour.  Keep all follow-up prenatal visits as told by your health care provider. This is important. Contact a health care provider if:  You have a fever.  You have continuous pain in your abdomen. Get help right away if:  Your contractions become stronger, more regular, and closer together.  You have fluid leaking or gushing from your vagina.  You pass blood-tinged mucus (bloody show).  You have bleeding from your vagina.  You have low back pain that you never had before.  You feel your baby's head pushing down and causing pelvic pressure.  Your baby is not moving inside you as much as it used to. Summary  Contractions that occur before labor are   called Braxton Hicks contractions, false labor, or practice contractions.  Braxton Hicks contractions are usually shorter, weaker, farther apart, and less regular than true labor contractions. True labor contractions usually become progressively stronger and regular, and they become more frequent.  Manage discomfort from Braxton Hicks contractions  by changing position, resting in a warm bath, drinking plenty of water, or practicing deep breathing. This information is not intended to replace advice given to you by your health care provider. Make sure you discuss any questions you have with your health care provider. Document Released: 11/24/2016 Document Revised: 04/25/2017 Document Reviewed: 11/24/2016 Elsevier Interactive Patient Education  2019 [employer]  

## 2018-08-22 NOTE — MAU Note (Signed)
Pt C/O contractions x 3 days, denies bleeding or LOF.  Reports good fetal movement.

## 2018-08-23 ENCOUNTER — Ambulatory Visit (INDEPENDENT_AMBULATORY_CARE_PROVIDER_SITE_OTHER): Payer: Medicaid Other | Admitting: Obstetrics and Gynecology

## 2018-08-23 ENCOUNTER — Encounter: Payer: Self-pay | Admitting: General Practice

## 2018-08-23 VITALS — BP 131/86 | HR 91 | Wt 171.0 lb

## 2018-08-23 DIAGNOSIS — Z3483 Encounter for supervision of other normal pregnancy, third trimester: Secondary | ICD-10-CM

## 2018-08-23 LAB — CULTURE, OB URINE
Culture: <10000 — AB
Special Requests: NORMAL
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
## 2018-08-24 LAB — CERVICOVAGINAL ANCILLARY ONLY
Bacterial vaginitis: NEGATIVE
Candida vaginitis: NEGATIVE
Trichomonas: NEGATIVE

## 2018-08-27 LAB — CULTURE, BETA STREP (GROUP B ONLY): Strep Gp B Culture: POSITIVE — AB

## 2018-08-28 ENCOUNTER — Encounter (HOSPITAL_COMMUNITY): Payer: Self-pay | Admitting: Emergency Medicine

## 2018-08-28 ENCOUNTER — Inpatient Hospital Stay (HOSPITAL_COMMUNITY)
Admission: AD | Admit: 2018-08-28 | Discharge: 2018-08-28 | Disposition: A | Discharge status: Home / Self Care | Payer: Medicaid Other | Attending: Family Medicine | Admitting: Family Medicine

## 2018-08-28 DIAGNOSIS — Z3A37 37 weeks gestation of pregnancy: Secondary | ICD-10-CM

## 2018-08-28 DIAGNOSIS — O26893 Other specified pregnancy related conditions, third trimester: Secondary | ICD-10-CM

## 2018-08-28 DIAGNOSIS — Z113 Encounter for screening for infections with a predominantly sexual mode of transmission: Secondary | ICD-10-CM | POA: Diagnosis not present

## 2018-08-28 DIAGNOSIS — O368130 Decreased fetal movements, third trimester, not applicable or unspecified: Secondary | ICD-10-CM | POA: Insufficient documentation

## 2018-08-28 DIAGNOSIS — B9689 Other specified bacterial agents as the cause of diseases classified elsewhere: Secondary | ICD-10-CM | POA: Diagnosis not present

## 2018-08-28 DIAGNOSIS — Z87891 Personal history of nicotine dependence: Secondary | ICD-10-CM | POA: Diagnosis not present

## 2018-08-28 DIAGNOSIS — O23593 Infection of other part of genital tract in pregnancy, third trimester: Secondary | ICD-10-CM | POA: Insufficient documentation

## 2018-08-28 DIAGNOSIS — N760 Acute vaginitis: Secondary | ICD-10-CM | POA: Diagnosis not present

## 2018-08-28 LAB — WET PREP, GENITAL
Sperm: NONE SEEN
Trich, Wet Prep: NONE SEEN
Yeast Wet Prep HPF POC: NONE SEEN

## 2018-08-28 MED ORDER — METRONIDAZOLE 500 MG PO TABS: 500.0000 mg | ORAL_TABLET | Freq: Two times a day (BID) | ORAL | 0 refills | Status: DC
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
## 2018-08-28 NOTE — Discharge Instructions (Signed)
Bacterial Vaginosis  Bacterial vaginosis is an infection of the vagina. It happens when too many normal germs (healthy bacteria) grow in the vagina. This infection puts you at risk for infections from sex (STIs). Treating this infection can lower your risk for some STIs. You should also treat this if you are pregnant. It can cause your baby to be born early. Follow these instructions at home: Medicines  Take over-the-counter and prescription medicines only as told by your doctor.  Take or use your antibiotic medicine as told by your doctor. Do not stop taking or using it even if you start to feel better. General instructions  If you your sexual partner is a woman, tell her that you have this infection. She needs to get treatment if she has symptoms. If you have a female partner, he does not need to be treated.  During treatment: ? Avoid sex. ? Do not douche. ? Avoid alcohol as told. ? Avoid breastfeeding as told.  Drink enough fluid to keep your pee (urine) clear or pale yellow.  Keep your vagina and butt (rectum) clean. ? Wash the area with warm water every day. ? Wipe from front to back after you use the toilet.  Keep all follow-up visits as told by your doctor. This is important. Preventing this condition  Do not douche.  Use only warm water to wash around your vagina.  Use protection when you have sex. This includes: ? Latex condoms. ? Dental dams.  Limit how many people you have sex with. It is best to only have sex with the same person (be monogamous).  Get tested for STIs. Have your partner get tested.  Wear underwear that is cotton or lined with cotton.  Avoid tight pants and pantyhose. This is most important in summer.  Do not use any products that have nicotine or tobacco in them. These include cigarettes and e-cigarettes. If you need help quitting, ask your doctor.  Do not use illegal drugs.  Limit how much alcohol you drink. Contact a doctor if:  Your  symptoms do not get better, even after you are treated.  You have more discharge or pain when you pee (urinate).  You have a fever.  You have pain in your belly (abdomen).  You have pain with sex.  Your bleed from your vagina between periods. Summary  This infection happens when too many germs (bacteria) grow in the vagina.  Treating this condition can lower your risk for some infections from sex (STIs).  You should also treat this if you are pregnant. It can cause early (premature) birth.  Do not stop taking or using your antibiotic medicine even if you start to feel better. This information is not intended to replace advice given to you by your health care provider. Make sure you discuss any questions you have with your health care provider. Document Released: 04/19/2008 Document Revised: 03/26/2016 Document Reviewed: 03/26/2016 Elsevier Interactive Patient Education  2019 Elsevier Inc.  Fetal Movement Counts Patient Name: ________________________________________________ Patient Due Date: ____________________ What is a fetal movement count?  A fetal movement count is the number of times that you feel your baby move during a certain amount of time. This may also be called a fetal kick count. A fetal movement count is recommended for every pregnant woman. You may be asked to start counting fetal movements as early as week 28 of your pregnancy. Pay attention to when your baby is most active. You may notice your baby's sleep and wake  cycles. You may also notice things that make your baby move more. You should do a fetal movement count:  When your baby is normally most active.  At the same time each day. A good time to count movements is while you are resting, after having something to eat and drink. How do I count fetal movements? 1. Find a quiet, comfortable area. Sit, or lie down on your side. 2. Write down the date, the start time and stop time, and the number of movements that  you felt between those two times. Take this information with you to your health care visits. 3. For 2 hours, count kicks, flutters, swishes, rolls, and jabs. You should feel at least 10 movements during 2 hours. 4. You may stop counting after you have felt 10 movements. 5. If you do not feel 10 movements in 2 hours, have something to eat and drink. Then, keep resting and counting for 1 hour. If you feel at least 4 movements during that hour, you may stop counting. Contact a health care provider if:  You feel fewer than 4 movements in 2 hours.  Your baby is not moving like he or she usually does. Date: ____________ Start time: ____________ Stop time: ____________ Movements: ____________ Date: ____________ Start time: ____________ Stop time: ____________ Movements: ____________ Date: ____________ Start time: ____________ Stop time: ____________ Movements: ____________ Date: ____________ Start time: ____________ Stop time: ____________ Movements: ____________ Date: ____________ Start time: ____________ Stop time: ____________ Movements: ____________ Date: ____________ Start time: ____________ Stop time: ____________ Movements: ____________ Date: ____________ Start time: ____________ Stop time: ____________ Movements: ____________ Date: ____________ Start time: ____________ Stop time: ____________ Movements: ____________ Date: ____________ Start time: ____________ Stop time: ____________ Movements: ____________ This information is not intended to replace advice given to you by your health care provider. Make sure you discuss any questions you have with your health care provider. Document Released: 08/10/2006 Document Revised: 03/09/2016 Document Reviewed: 08/20/2015 Elsevier Interactive Patient Education  2019 [employer]
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
## 2018-08-29 LAB — RPR: RPR Ser Ql: NONREACTIVE

## 2018-08-29 LAB — HEPATITIS B SURFACE ANTIGEN: Hepatitis B Surface Ag: NEGATIVE

## 2018-08-29 LAB — GC/CHLAMYDIA PROBE AMP (~~LOC~~) NOT AT ARMC
Chlamydia: NEGATIVE
Neisseria Gonorrhea: NEGATIVE

## 2018-08-29 LAB — HIV ANTIBODY (ROUTINE TESTING W REFLEX): HIV Screen 4th Generation wRfx: NONREACTIVE

## 2018-08-30 ENCOUNTER — Ambulatory Visit (INDEPENDENT_AMBULATORY_CARE_PROVIDER_SITE_OTHER): Payer: Medicaid Other | Admitting: Obstetrics and Gynecology

## 2018-08-30 VITALS — BP 138/88 | HR 87 | Wt 171.0 lb

## 2018-08-30 DIAGNOSIS — O219 Vomiting of pregnancy, unspecified: Secondary | ICD-10-CM

## 2018-08-30 DIAGNOSIS — Z3483 Encounter for supervision of other normal pregnancy, third trimester: Secondary | ICD-10-CM

## 2018-08-30 DIAGNOSIS — Z3480 Encounter for supervision of other normal pregnancy, unspecified trimester: Secondary | ICD-10-CM

## 2018-08-30 LAB — HEPATITIS C ANTIBODY: HCV Ab: <0.1 s/co ratio (ref 0.0–0.9)

## 2018-08-30 MED ORDER — METOCLOPRAMIDE HCL 10 MG PO TABS: 10.0000 mg | ORAL_TABLET | Freq: Four times a day (QID) | ORAL | 3 refills | Status: DC | PRN
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
## 2018-09-01 ENCOUNTER — Observation Stay (HOSPITAL_COMMUNITY)
Admission: AD | Admit: 2018-09-01 | Discharge: 2018-09-01 | Disposition: A | Discharge status: Home / Self Care | Payer: Medicaid Other | Source: Ambulatory Visit | Attending: Obstetrics and Gynecology | Admitting: Obstetrics and Gynecology

## 2018-09-01 ENCOUNTER — Other Ambulatory Visit: Payer: Self-pay

## 2018-09-01 ENCOUNTER — Encounter (HOSPITAL_COMMUNITY): Payer: Self-pay | Admitting: *Deleted

## 2018-09-01 DIAGNOSIS — O471 False labor at or after 37 completed weeks of gestation: Secondary | ICD-10-CM | POA: Diagnosis not present

## 2018-09-01 DIAGNOSIS — O479 False labor, unspecified: Secondary | ICD-10-CM | POA: Diagnosis present

## 2018-09-01 DIAGNOSIS — O4700 False labor before 37 completed weeks of gestation, unspecified trimester: Secondary | ICD-10-CM | POA: Diagnosis present

## 2018-09-01 DIAGNOSIS — Z3A37 37 weeks gestation of pregnancy: Secondary | ICD-10-CM | POA: Insufficient documentation

## 2018-09-01 DIAGNOSIS — O4693 Antepartum hemorrhage, unspecified, third trimester: Secondary | ICD-10-CM | POA: Diagnosis present

## 2018-09-01 LAB — TYPE AND SCREEN
ABO/RH(D): O POS
Antibody Screen: NEGATIVE

## 2018-09-01 LAB — CBC
HCT: 35.9 % — ABNORMAL LOW (ref 36.0–46.0)
Hemoglobin: 11.3 g/dL — ABNORMAL LOW (ref 12.0–15.0)
MCH: 27.5 pg (ref 26.0–34.0)
MCHC: 31.5 g/dL (ref 30.0–36.0)
MCV: 87.3 fL (ref 80.0–100.0)
PLATELETS: 294 10*3/uL (ref 150–400)
RBC: 4.11 MIL/uL (ref 3.87–5.11)
RDW: 13.2 % (ref 11.5–15.5)
WBC: 10.7 10*3/uL — ABNORMAL HIGH (ref 4.0–10.5)
nRBC: 0 % (ref 0.0–0.2)

## 2018-09-01 MED ORDER — SOD CITRATE-CITRIC ACID 500-334 MG/5ML PO SOLN: 30.0000 mL | ORAL | Status: DC | PRN

## 2018-09-01 MED ORDER — ACETAMINOPHEN 325 MG PO TABS: 650.0000 mg | ORAL_TABLET | ORAL | Status: DC | PRN

## 2018-09-01 MED ORDER — OXYTOCIN 40 UNITS IN NORMAL SALINE INFUSION - SIMPLE MED: 2.5000 [IU]/h | INTRAVENOUS | Status: DC

## 2018-09-01 MED ORDER — PENICILLIN G 3 MILLION UNITS IVPB - SIMPLE MED
3.0000 10*6.[IU] | INTRAVENOUS | Status: DC
  Filled 2018-09-01: qty 100

## 2018-09-01 MED ORDER — OXYCODONE-ACETAMINOPHEN 5-325 MG PO TABS: 1.0000 | ORAL_TABLET | ORAL | Status: DC | PRN

## 2018-09-01 MED ORDER — FLEET ENEMA 7-19 GM/118ML RE ENEM: 1.0000 | ENEMA | RECTAL | Status: DC | PRN

## 2018-09-01 MED ORDER — LIDOCAINE HCL (PF) 1 % IJ SOLN: 30.0000 mL | INTRAMUSCULAR | Status: DC | PRN

## 2018-09-01 MED ORDER — SODIUM CHLORIDE 0.9 % IV SOLN
5.0000 10*6.[IU] | Freq: Once | INTRAVENOUS | Status: DC
  Filled 2018-09-01: qty 5

## 2018-09-01 MED ORDER — LACTATED RINGERS IV SOLN: 500.0000 mL | INTRAVENOUS | Status: DC | PRN

## 2018-09-01 MED ORDER — ONDANSETRON HCL 4 MG/2ML IJ SOLN: 4.0000 mg | Freq: Four times a day (QID) | INTRAMUSCULAR | Status: DC | PRN

## 2018-09-01 MED ORDER — OXYTOCIN BOLUS FROM INFUSION: 500.0000 mL | Freq: Once | INTRAVENOUS | Status: DC

## 2018-09-01 MED ORDER — LACTATED RINGERS IV SOLN
INTRAVENOUS | Status: DC
  Administered 2018-09-01: 22:00:00 via INTRAVENOUS

## 2018-09-01 MED ORDER — OXYCODONE-ACETAMINOPHEN 5-325 MG PO TABS: 2.0000 | ORAL_TABLET | ORAL | Status: DC | PRN

## 2018-09-01 NOTE — MAU Note (Signed)
Pt reports to MAU c/o vaginal bleeding that started at 1800 bright red. Pt reports NO fetal movement since yesterday morning. Pt reports mild abdominal pain.

## 2018-09-01 NOTE — MAU Provider Note (Signed)
OBSTETRIC ADMISSION HISTORY AND PHYSICAL  Colleen Steele is a 18 y.o. female G2P0010 with IUP at [redacted]w[redacted]d by LMP presenting for vaginal bleeding. She reports +FMs, No LOF, no blurry vision, headaches or peripheral edema, and RUQ pain.  She plans on breast feeding. She request PP IUD for birth control. She received her prenatal care at [facility]  Dating: By second trimester [place] --->  Estimated Date of Delivery: 09/16/18  Sono:    @[redacted]w[redacted]d , CWD, normal anatomy, cephalic presentation,  208g, 24% EFW   Prenatal History/Complications:  Past Medical History: Past Medical History:  Diagnosis Date  . Asthma   . Chlamydia   . Chronic kidney disease   . Diarrhea   . Environmental allergies   . Gonorrhea   . Vomiting     Past Surgical History: Past Surgical History:  Procedure Laterality Date  . MOUTH SURGERY    . NO PAST SURGERIES      Obstetrical History: OB History    Gravida  2   Para  0   Term      Preterm      AB  1   Living  0     SAB      TAB      Ectopic      Multiple      Live Births              Social History: Social History   Socioeconomic History  . Marital status: Single    Spouse name: Not on file  . Number of children: Not on file  . Years of education: 93  . Highest education level: 12th grade  Occupational History  . Occupation: Teacher, early years/pre: [employer]  Social Needs  . Financial resource strain: Not hard at all  . Food insecurity:    Worry: Never true    Inability: Never true  . Transportation needs:    Medical: No    Non-medical: No  Tobacco Use  . Smoking status: Former Games developer  . Smokeless tobacco: Never Used  Substance and Sexual Activity  . Alcohol use: No  . Drug use: Not Currently    Types: Marijuana    Comment: Stopped 01/22/18  . Sexual activity: Yes    Birth control/protection: None  Lifestyle  . Physical activity:    Days per week: 5 days    Minutes per session: Not on file  .  Stress: To some extent  Relationships  . Social connections:    Talks on phone: More than three times a week    Gets together: Three times a week    Attends religious service: 1 to 4 times per year    Active member of club or organization: No    Attends meetings of clubs or organizations: Never    Relationship status: Never married  Other Topics Concern  . Not on file  Social History Narrative   7th grade    Family History: Family History  Problem Relation Age of Onset  . Diverticulitis Sister   . Hypertension Mother     Allergies: No Known Allergies  Medications Prior to Admission  Medication Sig Dispense Refill Last Dose  . cyclobenzaprine (FLEXERIL) 10 MG tablet Take 1 tablet (10 mg total) by mouth every 8 (eight) hours as needed for muscle spasms. 30 tablet 0 Past Month at Unknown time  . ferrous sulfate (FERROUSUL) 325 (65 FE) MG tablet Take 1 tablet (325 mg total) by mouth daily with  breakfast. 30 tablet 3 08/31/2018 at Unknown time  . metroNIDAZOLE (FLAGYL) 500 MG tablet Take 1 tablet (500 mg total) by mouth 2 (two) times daily. 14 tablet 0 09/01/2018 at Unknown time  . Prenatal MV-Min-FA-Omega-3 (PRENATAL GUMMIES/DHA & FA) 0.4-32.5 MG CHEW Chew 1 each by mouth daily. 30 tablet 12 Past Month at Unknown time  . albuterol (PROVENTIL HFA;VENTOLIN HFA) 108 (90 Base) MCG/ACT inhaler Inhale 1-2 puffs into the lungs every 4 (four) hours as needed for wheezing or shortness of breath.   Unknown at Unknown time  . metoCLOPramide (REGLAN) 10 MG tablet Take 1 tablet (10 mg total) by mouth every 6 (six) hours as needed for nausea. 30 tablet 3 More than a month at Unknown time     Review of Systems   All systems reviewed and negative except as stated in HPI  Blood pressure 129/84, pulse (!) 105, temperature 98.4 F (36.9 C), last menstrual period 11/29/2017, SpO2 100 %. General appearance: alert and cooperative Lungs: clear to auscultation bilaterally Heart: regular rate and  rhythm Abdomen: soft, non-tender; bowel sounds normal Pelvic: Small blood noted, no pooling Extremities: no sign of DVT Presentation: cephalic Fetal monitoringBaseline: 130 bpm, Variability: Good {> 6 bpm), Accelerations: Reactive and Decelerations: Absent Uterine activity Date/time of onset: today, unsure of timing and Frequency: Every 5 minutes Dilation: 4 Effacement (%): 50 Station: 0 Exam by:: Gerrit HeckJessica Emly, CNM   Prenatal labs: ABO, Rh: O/Positive/-- (08/08 1549) Antibody: Negative (08/08 1549) Rubella: 8.19 (08/08 1549) RPR: Non Reactive (02/04 2121)  HBsAg: Negative (02/04 2121)  HIV: Non Reactive (02/04 2121)  GBS:   Positive  2 hr Glucola 75-162-106 Genetic screening  NIPS normal  Anatomy US - nasal bone absent  Prenatal Transfer Tool  Maternal Diabetes: No Genetic Screening: Normal Maternal Ultrasounds/Referrals: Abnormal:  Findings:   Other: Fetal Ultrasounds or other Referrals:  None Maternal Substance Abuse:  No Significant Maternal Medications:  None Significant Maternal Lab Results: Lab values include: Group B Strep positive, Other: + GC/Chlamydia and trichomonas TOC negative for all   No results found for this or any previous visit (from the past 24 hour(s)).  Patient Active Problem List   Diagnosis Date Noted  . No leakage of amniotic fluid into vagina 08/13/2018  . Preterm contractions 08/13/2018  . Group B streptococcal carriage complicating pregnancy 06/23/2018  . Migraine headache 05/22/2018  . Trichomonal infection 04/20/2018  . Gonorrhea 04/20/2018  . Chlamydia 04/20/2018  . Supervision of normal first pregnancy, antepartum 03/01/2018  . Nausea/vomiting in pregnancy 03/01/2018    Assessment/Plan:  Colleen Steele is a 18 y.o. G2P0010 at 3137w6d here for SOL.   #Labor: Expectant management  #Pain: Undecided #FWB: Cat 1 #MOF: breast #MOC:PP IUD #Circ:  N/A - female  Vonzella NippleJulie Lerin Jech, PA-C  09/01/2018, 8:49 PM

## 2018-09-01 NOTE — Discharge Summary (Signed)
Obstetrics Discharge Summary OB/GYN Faculty Practice   Patient Name: Colleen Steele DOB: Feb 23, 2001 MRN: [id number]  Date of admission: 09/01/2018 Delivering MD: This patient has no babies on file.  Date of discharge: 09/01/2018  Admitting diagnosis: 37 WKS, BLEEDING Intrauterine pregnancy: [redacted]w[redacted]d     Secondary diagnosis:   Principal Problem:   False labor Active Problems:   Uterine contractions during pregnancy  Discharge diagnosis: False labor, [redacted] weeks gestation                                             Hospital course: Marchele Staub is a 18 y.o. [redacted]w[redacted]d who was admitted for presumed spontaneous onset of labor. She was seen in the MAU and changed from 3 to 4cm with descent of fetal station. On arrival to L&D, she was comfortable and not complaining of any pain with contractions. She states she feels like she has a little bit of cramping. On recheck of her cervix she was 3/50/-2, vertex by sutures. Reassuring and reactive fetal tracing, contractions every 1-3 minutes but not feeling them. Discussed that would not augment labor since <39 weeks and presentation was more consistent with false labor pains. Reviewed labor precautions and patient in agreement with plan.   Physical exam  Vitals:   09/01/18 1933 09/01/18 2144  BP: 129/84 133/87  Pulse: (!) 105 60  Resp:  16  Temp: 98.4 F (36.9 C) 98.8 F (37.1 C)  TempSrc:  Oral  SpO2: 100%   Weight:  77.6 kg  Height:  5\' 2"  (1.575 m)   General: well-appearing, NAD Abd: gravid, soft and non-tender DVT Evaluation: No significant calf/ankle edema. Labs: Lab Results  Component Value Date   WBC 10.7 (H) 09/01/2018   HGB 11.3 (L) 09/01/2018   HCT 35.9 (L) 09/01/2018   MCV 87.3 09/01/2018   PLT 294 09/01/2018   Discharge instructions: Labor precautions   After visit meds:  Allergies as of 09/01/2018   No Known Allergies     Medication List    TAKE these medications   albuterol 108 (90 Base) MCG/ACT inhaler Commonly  known as:  PROVENTIL HFA;VENTOLIN HFA Inhale 1-2 puffs into the lungs every 4 (four) hours as needed for wheezing or shortness of breath.   cyclobenzaprine 10 MG tablet Commonly known as:  FLEXERIL Take 1 tablet (10 mg total) by mouth every 8 (eight) hours as needed for muscle spasms.   ferrous sulfate 325 (65 FE) MG tablet Commonly known as:  FERROUSUL Take 1 tablet (325 mg total) by mouth daily with breakfast.   metoCLOPramide 10 MG tablet Commonly known as:  REGLAN Take 1 tablet (10 mg total) by mouth every 6 (six) hours as needed for nausea.   metroNIDAZOLE 500 MG tablet Commonly known as:  FLAGYL Take 1 tablet (500 mg total) by mouth 2 (two) times daily.   PRENATAL GUMMIES/DHA & FA 0.4-32.5 MG Chew Chew 1 each by mouth daily.      Diet: Routine Diet Activity: Advance as tolerated. Pelvic rest for 6 weeks.   Follow-up Appt: Future Appointments  Date Time Provider Department Center  09/06/2018  2:10 PM [name], CNM [facility] None  09/13/2018 10:50 AM [name], CNM [facility] None   Cristal Deer. Earlene Plater, DO OB/GYN Fellow, Faculty Practice

## 2018-09-01 NOTE — Discharge Instructions (Signed)
Return if you are having any of the following or if you are worried:  Contractions lasting at least a minute every 2-4 minutes  Vaginal bleeding   Leakage of fluids  Decreased fetal movement

## 2018-09-02 LAB — RPR: RPR: NONREACTIVE

## 2018-09-02 LAB — ABO/RH: ABO/RH(D): O POS

## 2018-09-03 ENCOUNTER — Encounter (HOSPITAL_COMMUNITY): Payer: Self-pay | Admitting: *Deleted

## 2018-09-03 ENCOUNTER — Inpatient Hospital Stay (HOSPITAL_COMMUNITY)
Admission: AD | Admit: 2018-09-03 | Discharge: 2018-09-03 | Disposition: A | Discharge status: Home / Self Care | Payer: Medicaid Other | Attending: Obstetrics and Gynecology | Admitting: Obstetrics and Gynecology

## 2018-09-03 ENCOUNTER — Inpatient Hospital Stay (HOSPITAL_BASED_OUTPATIENT_CLINIC_OR_DEPARTMENT_OTHER): Payer: Medicaid Other

## 2018-09-03 ENCOUNTER — Other Ambulatory Visit: Payer: Self-pay

## 2018-09-03 DIAGNOSIS — Z3A38 38 weeks gestation of pregnancy: Secondary | ICD-10-CM | POA: Diagnosis not present

## 2018-09-03 DIAGNOSIS — O9A213 Injury, poisoning and certain other consequences of external causes complicating pregnancy, third trimester: Secondary | ICD-10-CM | POA: Diagnosis not present

## 2018-09-03 DIAGNOSIS — O26893 Other specified pregnancy related conditions, third trimester: Secondary | ICD-10-CM | POA: Insufficient documentation

## 2018-09-03 DIAGNOSIS — O359XX0 Maternal care for (suspected) fetal abnormality and damage, unspecified, not applicable or unspecified: Secondary | ICD-10-CM | POA: Diagnosis not present

## 2018-09-03 DIAGNOSIS — Z87891 Personal history of nicotine dependence: Secondary | ICD-10-CM | POA: Insufficient documentation

## 2018-09-03 DIAGNOSIS — R102 Pelvic and perineal pain: Secondary | ICD-10-CM | POA: Insufficient documentation

## 2018-09-03 LAB — URINALYSIS, ROUTINE W REFLEX MICROSCOPIC
BILIRUBIN URINE: NEGATIVE
Glucose, UA: NEGATIVE mg/dL
Ketones, ur: NEGATIVE mg/dL
Nitrite: NEGATIVE
Protein, ur: NEGATIVE mg/dL
Specific Gravity, Urine: 1.01 (ref 1.005–1.030)
pH: 6.5 (ref 5.0–8.0)

## 2018-09-03 LAB — URINALYSIS, MICROSCOPIC (REFLEX)
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
## 2018-09-03 NOTE — Discharge Instructions (Signed)
Please return to Maternity Admissions Unit for increased bright, red bleeding like a period, leaking fluid, contractions every 5 mins x 1 hour, no or decreased fetal movement.

## 2018-09-04 ENCOUNTER — Encounter (HOSPITAL_COMMUNITY): Payer: Self-pay | Admitting: *Deleted

## 2018-09-04 ENCOUNTER — Inpatient Hospital Stay (HOSPITAL_COMMUNITY)
Admission: AD | Admit: 2018-09-04 | Discharge: 2018-09-05 | Disposition: A | Discharge status: Home / Self Care | Payer: Medicaid Other | Source: Home / Self Care | Attending: Family Medicine | Admitting: Family Medicine

## 2018-09-04 DIAGNOSIS — O9A213 Injury, poisoning and certain other consequences of external causes complicating pregnancy, third trimester: Secondary | ICD-10-CM

## 2018-09-04 DIAGNOSIS — O219 Vomiting of pregnancy, unspecified: Secondary | ICD-10-CM

## 2018-09-04 DIAGNOSIS — Z0371 Encounter for suspected problem with amniotic cavity and membrane ruled out: Secondary | ICD-10-CM

## 2018-09-04 DIAGNOSIS — O479 False labor, unspecified: Secondary | ICD-10-CM

## 2018-09-04 DIAGNOSIS — A549 Gonococcal infection, unspecified: Secondary | ICD-10-CM

## 2018-09-04 DIAGNOSIS — A599 Trichomoniasis, unspecified: Secondary | ICD-10-CM

## 2018-09-04 DIAGNOSIS — Z3400 Encounter for supervision of normal first pregnancy, unspecified trimester: Secondary | ICD-10-CM

## 2018-09-04 NOTE — MAU Note (Signed)
PT SAYS SHE WAS HERE YESTERDAY-   SAYS HAS  FLUID AND  SPOTTING .    UC STARTED STRONG SINCE  6PM    .  DENIES HSV AND MRSA.

## 2018-09-05 ENCOUNTER — Inpatient Hospital Stay (HOSPITAL_COMMUNITY)
Admission: AD | Admit: 2018-09-05 | Discharge: 2018-09-07 | DRG: 806 | Disposition: A | Discharge status: Home / Self Care | Payer: Medicaid Other | Attending: Obstetrics & Gynecology | Admitting: Obstetrics & Gynecology

## 2018-09-05 ENCOUNTER — Encounter (HOSPITAL_COMMUNITY): Payer: Self-pay | Admitting: *Deleted

## 2018-09-05 DIAGNOSIS — O99824 Streptococcus B carrier state complicating childbirth: Secondary | ICD-10-CM | POA: Diagnosis present

## 2018-09-05 DIAGNOSIS — Z3A38 38 weeks gestation of pregnancy: Secondary | ICD-10-CM | POA: Diagnosis not present

## 2018-09-05 DIAGNOSIS — Z87891 Personal history of nicotine dependence: Secondary | ICD-10-CM

## 2018-09-05 DIAGNOSIS — O99324 Drug use complicating childbirth: Secondary | ICD-10-CM | POA: Diagnosis present

## 2018-09-05 DIAGNOSIS — F1290 Cannabis use, unspecified, uncomplicated: Secondary | ICD-10-CM | POA: Diagnosis present

## 2018-09-05 DIAGNOSIS — Z30017 Encounter for initial prescription of implantable subdermal contraceptive: Secondary | ICD-10-CM | POA: Diagnosis not present

## 2018-09-05 DIAGNOSIS — Z3483 Encounter for supervision of other normal pregnancy, third trimester: Secondary | ICD-10-CM | POA: Diagnosis present

## 2018-09-05 LAB — TYPE AND SCREEN
ABO/RH(D): O POS
Antibody Screen: NEGATIVE

## 2018-09-05 LAB — CBC
HCT: 32.8 % — ABNORMAL LOW (ref 36.0–46.0)
Hemoglobin: 10.6 g/dL — ABNORMAL LOW (ref 12.0–15.0)
MCH: 27.6 pg (ref 26.0–34.0)
MCHC: 32.3 g/dL (ref 30.0–36.0)
MCV: 85.4 fL (ref 80.0–100.0)
Platelets: 276 10*3/uL (ref 150–400)
RBC: 3.84 MIL/uL — ABNORMAL LOW (ref 3.87–5.11)
RDW: 13.1 % (ref 11.5–15.5)
WBC: 20.1 10*3/uL — ABNORMAL HIGH (ref 4.0–10.5)
nRBC: 0 % (ref 0.0–0.2)

## 2018-09-05 LAB — COMPREHENSIVE METABOLIC PANEL
ALT: 13 U/L (ref 0–44)
AST: 20 U/L (ref 15–41)
Albumin: 3.2 g/dL — ABNORMAL LOW (ref 3.5–5.0)
Alkaline Phosphatase: 305 U/L — ABNORMAL HIGH (ref 38–126)
Anion gap: 11 (ref 5–15)
BUN: <5 mg/dL — ABNORMAL LOW (ref 6–20)
CO2: 20 mmol/L — ABNORMAL LOW (ref 22–32)
Calcium: 8.7 mg/dL — ABNORMAL LOW (ref 8.9–10.3)
Chloride: 103 mmol/L (ref 98–111)
Creatinine, Ser: 0.57 mg/dL (ref 0.44–1.00)
GFR calc Af Amer: >60 mL/min (ref >60)
GFR calc non Af Amer: >60 mL/min (ref >60)
Glucose, Bld: 89 mg/dL (ref 70–99)
Potassium: 3.9 mmol/L (ref 3.5–5.1)
Sodium: 134 mmol/L — ABNORMAL LOW (ref 135–145)
Total Bilirubin: 1.3 mg/dL — ABNORMAL HIGH (ref 0.3–1.2)
Total Protein: 6.5 g/dL (ref 6.5–8.1)

## 2018-09-05 MED ORDER — ONDANSETRON HCL 4 MG PO TABS: 4.0000 mg | ORAL_TABLET | ORAL | Status: DC | PRN

## 2018-09-05 MED ORDER — SIMETHICONE 80 MG PO CHEW: 80.0000 mg | CHEWABLE_TABLET | ORAL | Status: DC | PRN

## 2018-09-05 MED ORDER — ZOLPIDEM TARTRATE 5 MG PO TABS: 5.0000 mg | ORAL_TABLET | Freq: Every evening | ORAL | Status: DC | PRN

## 2018-09-05 MED ORDER — ONDANSETRON HCL 4 MG/2ML IJ SOLN: 4.0000 mg | INTRAMUSCULAR | Status: DC | PRN

## 2018-09-05 MED ORDER — PRENATAL MULTIVITAMIN CH
1.0000 | ORAL_TABLET | Freq: Every day | ORAL | Status: DC
  Administered 2018-09-06 – 2018-09-07 (×2): 1 via ORAL
  Filled 2018-09-05 (×2): qty 1

## 2018-09-05 MED ORDER — TETANUS-DIPHTH-ACELL PERTUSSIS 5-2.5-18.5 LF-MCG/0.5 IM SUSP: 0.5000 mL | Freq: Once | INTRAMUSCULAR | Status: DC

## 2018-09-05 MED ORDER — WITCH HAZEL-GLYCERIN EX PADS: 1.0000 "application " | MEDICATED_PAD | CUTANEOUS | Status: DC | PRN

## 2018-09-05 MED ORDER — ACETAMINOPHEN 325 MG PO TABS: 650.0000 mg | ORAL_TABLET | ORAL | Status: DC | PRN

## 2018-09-05 MED ORDER — DIBUCAINE 1 % RE OINT: 1.0000 "application " | TOPICAL_OINTMENT | RECTAL | Status: DC | PRN

## 2018-09-05 MED ORDER — SENNOSIDES-DOCUSATE SODIUM 8.6-50 MG PO TABS
2.0000 | ORAL_TABLET | ORAL | Status: DC
  Administered 2018-09-05 – 2018-09-07 (×2): 2 via ORAL
  Filled 2018-09-05 (×2): qty 2

## 2018-09-05 MED ORDER — COCONUT OIL OIL
1.0000 "application " | TOPICAL_OIL | Status: DC | PRN
  Administered 2018-09-06: 1 via TOPICAL
  Filled 2018-09-05: qty 120

## 2018-09-05 MED ORDER — OXYTOCIN 10 UNIT/ML IJ SOLN
INTRAMUSCULAR | Status: AC
  Administered 2018-09-05: 10 [IU] via INTRAMUSCULAR
  Filled 2018-09-05: qty 1

## 2018-09-05 MED ORDER — MORPHINE SULFATE (PF) 4 MG/ML IV SOLN
2.0000 mg | Freq: Once | INTRAVENOUS | Status: AC
  Administered 2018-09-05: 2 mg via INTRAMUSCULAR
  Filled 2018-09-05: qty 1

## 2018-09-05 MED ORDER — BENZOCAINE-MENTHOL 20-0.5 % EX AERO
1.0000 "application " | INHALATION_SPRAY | CUTANEOUS | Status: DC | PRN
  Administered 2018-09-06: 1 via TOPICAL
  Filled 2018-09-05: qty 56

## 2018-09-05 MED ORDER — OXYTOCIN 10 UNIT/ML IJ SOLN
10.0000 [IU] | Freq: Once | INTRAMUSCULAR | Status: AC
  Administered 2018-09-05: 10 [IU] via INTRAMUSCULAR

## 2018-09-05 MED ORDER — DIPHENHYDRAMINE HCL 25 MG PO CAPS: 25.0000 mg | ORAL_CAPSULE | Freq: Four times a day (QID) | ORAL | Status: DC | PRN

## 2018-09-05 MED ORDER — IBUPROFEN 600 MG PO TABS
600.0000 mg | ORAL_TABLET | Freq: Four times a day (QID) | ORAL | Status: DC
  Administered 2018-09-05 – 2018-09-07 (×8): 600 mg via ORAL
  Filled 2018-09-05 (×8): qty 1

## 2018-09-05 NOTE — MAU Note (Signed)
Pt presented to MAU with urge to push. Assisted patient to bed via w/c. Midwife notified and at bedside. Crowning noted . Actively pushing. Marland Kitchen SROM at home clear fluid. At 1728 NSV delivery of  Viable female. Loose nuchal cord x1 with hand presentation.
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
## 2018-09-05 NOTE — MAU Note (Signed)
I have communicated with Joellyn Haff, CNM and reviewed vital signs:  Vitals:   09/05/18 0100 09/05/18 0102  BP: 137/84 137/84  Pulse:  99  Temp:      Vaginal exam:  Dilation: 3.5 Effacement (%): 70 Cervical Position: Posterior Station: -2 Presentation: Vertex Exam by:: T Velia Meyer RN ,   Also reviewed contraction pattern and that non-stress test is reactive.  It has been documented that patient is contracting every 2-4 minutes with no cervical change over 1 hours not indicating active labor.  Patient denies any other complaints.  Based on this report provider has given order for discharge.  A discharge order and diagnosis entered by a provider.   Labor discharge instructions reviewed with patient.

## 2018-09-05 NOTE — MAU Provider Note (Signed)
Colleen Steele is a 18 y.o. G2P0010 female at [redacted]w[redacted]d  RN Labor check, not seen by provider SVE by RN: Dilation: 3.5 Effacement (%): 70 Station: -2 Exam by:: T Velia Meyer RN  x 2, same as exam on 2/10 Requests pain med, morphine 2mg  IM x 1, then d/c NST: FHR baseline 125 bpm, Variability: moderate, Accelerations:present, Decelerations:  Absent= Cat 1/Reactive Toco: q 2-67mins  D/C home  Cheral Marker CNM, [facility] 09/05/2018 12:31 AM

## 2018-09-06 ENCOUNTER — Encounter: Payer: Medicaid Other | Admitting: Obstetrics and Gynecology

## 2018-09-06 ENCOUNTER — Encounter (HOSPITAL_COMMUNITY): Payer: Self-pay | Admitting: Family Medicine

## 2018-09-06 LAB — RPR: RPR Ser Ql: NONREACTIVE

## 2018-09-06 MED ORDER — FERROUS SULFATE 325 (65 FE) MG PO TABS
325.0000 mg | ORAL_TABLET | Freq: Every day | ORAL | Status: DC
  Administered 2018-09-07: 325 mg via ORAL
  Filled 2018-09-06: qty 1

## 2018-09-06 MED ORDER — ETONOGESTREL 68 MG ~~LOC~~ IMPL
68.0000 mg | DRUG_IMPLANT | Freq: Once | SUBCUTANEOUS | Status: AC
  Administered 2018-09-06: 68 mg via SUBCUTANEOUS
  Filled 2018-09-06: qty 1

## 2018-09-06 MED ORDER — LACTATED RINGERS IV SOLN: 500.0000 mL | INTRAVENOUS | Status: DC | PRN

## 2018-09-06 MED ORDER — OXYTOCIN BOLUS FROM INFUSION: 500.0000 mL | Freq: Once | INTRAVENOUS | Status: DC

## 2018-09-06 MED ORDER — LIDOCAINE HCL 1 % IJ SOLN
0.0000 mL | Freq: Once | INTRAMUSCULAR | Status: AC | PRN
  Administered 2018-09-06: 20 mL via INTRADERMAL
  Filled 2018-09-06: qty 20

## 2018-09-06 MED ORDER — ONDANSETRON HCL 4 MG/2ML IJ SOLN: 4.0000 mg | Freq: Four times a day (QID) | INTRAMUSCULAR | Status: DC | PRN

## 2018-09-06 MED ORDER — LACTATED RINGERS IV SOLN: INTRAVENOUS | Status: DC

## 2018-09-06 MED ORDER — ACETAMINOPHEN 325 MG PO TABS: 650.0000 mg | ORAL_TABLET | ORAL | Status: DC | PRN

## 2018-09-06 MED ORDER — OXYTOCIN 40 UNITS IN NORMAL SALINE INFUSION - SIMPLE MED: 2.5000 [IU]/h | INTRAVENOUS | Status: DC

## 2018-09-06 MED ORDER — OXYCODONE-ACETAMINOPHEN 5-325 MG PO TABS: 1.0000 | ORAL_TABLET | ORAL | Status: DC | PRN

## 2018-09-06 MED ORDER — LIDOCAINE HCL (PF) 1 % IJ SOLN
30.0000 mL | INTRAMUSCULAR | Status: DC | PRN
  Filled 2018-09-06: qty 30

## 2018-09-06 MED ORDER — SOD CITRATE-CITRIC ACID 500-334 MG/5ML PO SOLN: 30.0000 mL | ORAL | Status: DC | PRN

## 2018-09-06 MED ORDER — OXYCODONE-ACETAMINOPHEN 5-325 MG PO TABS: 2.0000 | ORAL_TABLET | ORAL | Status: DC | PRN

## 2018-09-06 NOTE — Lactation Note (Addendum)
This note was copied from a baby's chart. Lactation Consultation Note  Patient Name: Colleen Steele [id number] Date: 09/06/2018 Reason for consult: Initial assessment;Primapara:early term. Mom with hx of mj use during pregnancy. Baby Colleen Colleen Steele now 2 hours old. Mom reports she plans to do both bf and ff.  Baby Colleen Colleen Steele sucking on her hands on arrival.  Offered to assist with breastfeeding.  Mom reports she just was feeding her a bottle will give her more of that.  Mom reports she is not latching to the breast.  Mom reports she gets there and licks and plays but does not breastfeed.  Mom asked about a nipple shield.  Let her know we would try one if she needed it. Offered to get mom pumping with DEBP if Colleen Steele was not breastfeeding.  Mom reported yes.  When I took pump and all equipment back in there mom reported she will pump later.  Urged mom to call lactation as needed.  Gave yellow feeding logs, [facility] breastfeeding consultation services and breastfeeding Resource services.  Maternal Data Has patient been taught Hand Expression?: No  Feeding Nipple Type: Slow - flow  LATCH Score                   Interventions Interventions: DEBP  Lactation Tools Discussed/Used WIC Program: Yes Initiated by:: Miner Koral Date initiated:: 09/06/18(took pump /mom reports  doesnt want to pump while alone)   Consult Status Consult Status: PRN    Neomia Dear 09/06/2018, 5:29 PM

## 2018-09-06 NOTE — Progress Notes (Addendum)
Post Partum Day 1 Subjective: no complaints, up ad lib, voiding, tolerating PO and + flatus  Objective: Blood pressure 137/88, pulse 86, temperature 98.2 F (36.8 C), temperature source Oral, resp. rate 16, last menstrual period 11/29/2017, SpO2 99 %.  Physical Exam:  General: alert, cooperative and no distress Lochia: appropriate Uterine Fundus: firm Incision: N/A DVT Evaluation: No evidence of DVT seen on physical exam. No significant calf/ankle edema.  Recent Labs    09/05/18 1846  HGB 10.6*  HCT 32.8*    Assessment/Plan: Contraception Nexplanon plan to place prior to d/c  Continue routine PNC  Plan to d/c on PPD #2   LOS: 1 day   Johney MaineKelsey Killeen 09/06/2018, 7:44 AM   OB FELLOW POSTPARTUM PROGRESS NOTE ATTESTATION  I have seen and examined this patient and agree with above documentation in the student's note.   Marcy Sirenatherine Debbrah Sampedro, D.O. OB Fellow  09/08/2018, 11:35 AM
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
## 2018-09-06 NOTE — Procedures (Signed)
PRE-OP DIAGNOSIS: desired long-term, reversible contraception   POST-OP DIAGNOSIS: Same   PROCEDURE: Nexplanon  placement  Performing Physician: Dollene Cleveland   PROCEDURE:  Urine pregnancy test: Negative Written informed consent obtained Site (check): Left arm        Sterile Preparation:    [X]       Betadine        [_]     Chloraprep             After a time-out, insertion site was selected 6 - 10 cm from medial epicondyle and 3-5 cm inferior from sulcus and marked. Procedure area was prepped and draped in a sterile fashion. 5 mL of 1% lidocaine no epinephrine was used for subcutaneous anesthesia. Anesthesia confirmed.  Nexplanon  trocar was inserted subcutaneously and then Nexplanon  capsule delivered subcutaneously. Trocar was removed from the insertion site. Nexplanon  capsule was palpated by provider and patient to assure satisfactory placement.  Estimated blood loss: minimal Dressings applied: steristrips, 2x2 gauze, [place] Followup: The patient tolerated the procedure well without complications.  Standard post-procedure care wass explained and return precautions were given.  Lot number: [id number]    Dollene Cleveland, DO PGY-1,  [facility] 09/06/2018 10:43 AM

## 2018-09-07 ENCOUNTER — Other Ambulatory Visit: Payer: Self-pay

## 2018-09-07 MED ORDER — SENNOSIDES-DOCUSATE SODIUM 8.6-50 MG PO TABS: 2.0000 | ORAL_TABLET | ORAL | 0 refills | Status: DC

## 2018-09-07 MED ORDER — IBUPROFEN 600 MG PO TABS: 600.0000 mg | ORAL_TABLET | Freq: Four times a day (QID) | ORAL | 0 refills | Status: DC

## 2018-09-07 MED ORDER — ACETAMINOPHEN 325 MG PO TABS: 650.0000 mg | ORAL_TABLET | ORAL | 0 refills | Status: DC | PRN

## 2018-09-07 NOTE — Discharge Summary (Addendum)
OB Discharge Summary     Patient Name: Colleen Steele DOB: 2000/11/19 MRN: [id number]  Date of admission: 09/05/2018 Delivering MD: Donette Larry   Date of discharge: 09/07/2018  Admitting diagnosis: labor Intrauterine pregnancy: [redacted]w[redacted]d     Secondary diagnosis:  Active Problems:   Precipitous delivery  Additional problems: none     Discharge diagnosis: Term Pregnancy Delivered                                                                                                Post partum procedures:none  Augmentation: none  Complications: None  Hospital course:  Onset of Labor With Vaginal Delivery     18 y.o. yo G2P1011 at [redacted]w[redacted]d was admitted in Active Labor on 09/05/2018. Patient had an uncomplicated labor course as follows:  Membrane Rupture Time/Date: 5:00 PM ,09/05/2018   Intrapartum Procedures: Episiotomy: None [1]                                         Lacerations:  1st degree [2]  Patient had a delivery of a Viable infant. 09/05/2018  Information for the patient's newborn:  Owen, Girl Pura [[id number]]       Pateint had an uncomplicated postpartum course.  She is ambulating, tolerating a regular diet, passing flatus, and urinating well. Patient is discharged home in stable condition on 09/07/18.   Physical exam  Vitals:   09/06/18 1356 09/06/18 2216 09/07/18 0500 09/07/18 0700  BP: 129/86 129/88 122/76   Pulse: 90 82 87   Resp: 18 18 16    Temp: 98.5 F (36.9 C) 98.5 F (36.9 C) 98.4 F (36.9 C)   TempSrc: Oral Oral Oral   SpO2:   100%   Weight:    78.5 kg  Height:    5\' 2"  (1.575 m)   General: alert, cooperative and no distress Lochia: appropriate Uterine Fundus: firm Incision: N/A DVT Evaluation: No evidence of DVT seen on physical exam. No cords or calf tenderness. No significant calf/ankle edema. Labs: Lab Results  Component Value Date   WBC 20.1 (H) 09/05/2018   HGB 10.6 (L) 09/05/2018   HCT 32.8 (L) 09/05/2018   MCV 85.4 09/05/2018   PLT 276 09/05/2018   CMP Latest Ref Rng & Units 09/05/2018  Glucose 70 - 99 mg/dL 89  BUN 6 - 20 mg/dL <8(T)  Creatinine 2.54 - 1.00 mg/dL 9.82  Sodium 641 - 583 mmol/L 134(L)  Potassium 3.5 - 5.1 mmol/L 3.9  Chloride 98 - 111 mmol/L 103  CO2 22 - 32 mmol/L 20(L)  Calcium 8.9 - 10.3 mg/dL 0.9(M)  Total Protein 6.5 - 8.1 g/dL 6.5  Total Bilirubin 0.3 - 1.2 mg/dL 0.7(W)  Alkaline Phos 38 - 126 U/L 305(H)  AST 15 - 41 U/L 20  ALT 0 - 44 U/L 13    Discharge instruction: per After Visit Summary and "Baby and Me Booklet".  After visit meds:  Allergies as of 09/07/2018   No Known Allergies     Medication  List    TAKE these medications   acetaminophen 325 MG tablet Commonly known as:  TYLENOL Take 2 tablets (650 mg total) by mouth every 4 (four) hours as needed (mild discomfortORtemperature>/= 100.4 F).   cyclobenzaprine 10 MG tablet Commonly known as:  FLEXERIL Take 1 tablet (10 mg total) by mouth every 8 (eight) hours as needed for muscle spasms.   ferrous sulfate 325 (65 FE) MG tablet Commonly known as:  FERROUSUL Take 1 tablet (325 mg total) by mouth daily with breakfast.   ibuprofen 600 MG tablet Commonly known as:  ADVIL,MOTRIN Take 1 tablet (600 mg total) by mouth every 6 (six) hours.   metroNIDAZOLE 500 MG tablet Commonly known as:  FLAGYL Take 1 tablet (500 mg total) by mouth 2 (two) times daily.   PRENATAL GUMMIES/DHA & FA 0.4-32.5 MG Chew Chew 1 each by mouth daily.   senna-docusate 8.6-50 MG tablet Commonly known as:  Senokot-S Take 2 tablets by mouth daily. Start taking on:  September 08, 2018       Diet: routine diet  Activity: Advance as tolerated. Pelvic rest for 6 weeks.   Outpatient follow up:6 weeks Follow up Appt: Future Appointments  Date Time Provider Department Center  09/12/2018  2:00 PM [facility] NURSE [facility] None  10/18/2018  2:30 PM [name], CNM [facility] None   Follow up Visit:No follow-ups on file.  Postpartum  contraception: Nexplanon already placed  Newborn Data: Live born female  Birth Weight: 6 lb 14.2 oz (3125 g) APGAR: 9, 9  Newborn Delivery   Birth date/time:  09/05/2018 17:28:00 Delivery type:       Baby Feeding: Breast Disposition: will go home with mother, however, patient does not have car seat with her   09/07/2018 Standley Brooking, DO  OB FELLOW DISCHARGE ATTESTATION  I have seen and examined this patient and agree with above documentation in the resident's note.   Gwenevere Abbot, MD  OB Fellow  09/08/2018, 6:44 AM

## 2018-09-12 ENCOUNTER — Ambulatory Visit: Payer: Medicaid Other

## 2018-09-13 ENCOUNTER — Encounter: Payer: Medicaid Other | Admitting: Obstetrics and Gynecology

## 2018-09-18 ENCOUNTER — Ambulatory Visit (INDEPENDENT_AMBULATORY_CARE_PROVIDER_SITE_OTHER): Payer: Medicaid Other | Admitting: *Deleted

## 2018-09-18 ENCOUNTER — Other Ambulatory Visit: Payer: Self-pay

## 2018-09-18 VITALS — BP 128/80 | HR 71 | Temp 97.9°F | Ht 62.0 in | Wt 152.0 lb

## 2018-09-18 DIAGNOSIS — Z0130 Encounter for examination of blood pressure without abnormal findings: Secondary | ICD-10-CM

## 2018-09-18 DIAGNOSIS — O165 Unspecified maternal hypertension, complicating the puerperium: Secondary | ICD-10-CM

## 2018-09-18 NOTE — Progress Notes (Signed)
   Subjective:  Colleen Steele is a 18 y.o. female here for BP check.   Hypertension ROS: taking medications as instructed, no medication side effects noted, patient does not perform home BP monitoring, no TIA's, no chest pain on exertion, no dyspnea on exertion, no swelling of ankles, no orthostatic dizziness or lightheadedness, no orthopnea or paroxysmal nocturnal dyspnea and no palpitations.    Objective:  BP (!) 145/84 (BP Location: Right Arm, Patient Position: Sitting, Cuff Size: Normal)   Pulse 77   Temp 97.9 F (36.6 C) (Oral)   Ht 5\' 2"  (1.575 m)   Wt 152 lb (68.9 kg)   Breastfeeding No   BMI 27.80 kg/m   Repeat BP: 128/80 manually left arm, sitting, normal cuff size, Heart rate: 71    Appearance alert, well appearing, and in no distress, oriented to person, place, and time, normal appearing weight and well hydrated. General exam BP noted to be well controlled today in office.    Assessment:   Blood Pressure stable, asymptomatic and needs further observation.   Plan:  Current treatment plan is effective, no change in therapy. Follow up: 1 week and as needed.. Advised patient to return to [facility] if any symptoms listed above occurs. Advised to take blood pressure reading at home and send message via MyChart with result until appointment on Monday.

## 2018-09-24 ENCOUNTER — Ambulatory Visit: Payer: Medicaid Other

## 2018-09-27 ENCOUNTER — Encounter (HOSPITAL_COMMUNITY): Payer: Self-pay | Admitting: *Deleted

## 2018-10-17 ENCOUNTER — Telehealth: Payer: Self-pay | Admitting: General Practice

## 2018-10-17 NOTE — Telephone Encounter (Signed)
Pt informed of Telehealth visit for tomorrow, 10/18/2018 at 2:30pm.  Pt verbalized understanding.

## 2018-10-18 ENCOUNTER — Ambulatory Visit (INDEPENDENT_AMBULATORY_CARE_PROVIDER_SITE_OTHER): Payer: Medicaid Other | Admitting: Obstetrics and Gynecology

## 2018-10-18 ENCOUNTER — Other Ambulatory Visit: Payer: Self-pay

## 2018-10-18 DIAGNOSIS — Z1389 Encounter for screening for other disorder: Secondary | ICD-10-CM | POA: Diagnosis not present

## 2018-10-18 NOTE — Progress Notes (Signed)
   TELEHEALTH VIRTUAL POSTPARTUM VISIT ENCOUNTER NOTE  I connected with Colleen Steele on 10/19/18 at  2:30 PM EDT by telephone at home and verified that I am speaking with the correct person using two identifiers.   I discussed the limitations, risks, security and privacy concerns of performing an evaluation and management service by telephone and the availability of in person appointments. I also discussed with the patient that there may be a patient responsible charge related to this service. The patient expressed understanding and agreed to proceed.  Colleen Steele is a 18 y.o. G6P1011 female who presents for a postpartum visit. She is 6 weeks postpartum following a spontaneous vaginal delivery. I have fully reviewed the prenatal and intrapartum course. The delivery was at 38 gestational weeks.  Anesthesia: none. Postpartum course has been uncomplicated. She reports some occasional H/As and blurry vision. Baby's course has been uncomplicated. Baby is feeding by bottle - Gerber Gentle Good Start. Bleeding started her cycle, unsure when it started. Bowel function is normal. Bladder function is normal. Patient is not sexually active. Contraception method is Nexplanon. Postpartum depression screening: negative. She reports she and the FOB are no longer in a relationship, but she is "enjoying co-parenting apart."   Past Medical History:  Diagnosis Date  . Asthma   . Chlamydia   . Chronic kidney disease   . Diarrhea   . Environmental allergies   . Gonorrhea   . Vomiting    Past Surgical History:  Procedure Laterality Date  . MOUTH SURGERY    . NO PAST SURGERIES     The following portions of the patient's history were reviewed and updated as appropriate: allergies, current medications, past family history, past medical history, past social history, past surgical history and problem list.   Health Maintenance:  No Pap or mammogram hx d/t age.  Review of Systems:  Pertinent items noted  in HPI and remainder of comprehensive ROS otherwise negative.  Physical Exam:  Physical exam deferred due to nature of the encounter   Assessment and Plan:   Encounter for postpartum visit 1. Normal postpartum visit.  2. Contraception: Nexplanon (inserted immediate PP) 3. Follow-up with PCP for H/As 4. Follow up in 1 year or as needed.    I discussed the assessment and treatment plan with the patient. The patient was provided an opportunity to ask questions and all were answered. The patient agreed with the plan and demonstrated an understanding of the instructions.   The patient was advised to call back or seek an in-person evaluation/go to the ED if the symptoms worsen or if the condition fails to improve as anticipated.  I provided 15 minutes of non-face-to-face time during this encounter.   [name], CNM [facility] [employer], [facility]

## 2019-05-02 ENCOUNTER — Encounter (HOSPITAL_COMMUNITY): Payer: Self-pay

## 2019-05-02 ENCOUNTER — Ambulatory Visit (HOSPITAL_COMMUNITY)
Admission: EM | Admit: 2019-05-02 | Discharge: 2019-05-02 | Disposition: A | Discharge status: Home / Self Care | Payer: Medicaid Other | Attending: Nurse Practitioner | Admitting: Nurse Practitioner

## 2019-05-02 ENCOUNTER — Other Ambulatory Visit: Payer: Self-pay

## 2019-05-02 DIAGNOSIS — R519 Headache, unspecified: Secondary | ICD-10-CM | POA: Diagnosis not present

## 2019-05-02 DIAGNOSIS — S161XXA Strain of muscle, fascia and tendon at neck level, initial encounter: Secondary | ICD-10-CM | POA: Diagnosis not present

## 2019-05-02 MED ORDER — KETOROLAC TROMETHAMINE 60 MG/2ML IM SOLN: 60.0000 mg | Freq: Once | INTRAMUSCULAR | Status: DC

## 2019-05-02 MED ORDER — CYCLOBENZAPRINE HCL 10 MG PO TABS: 10.0000 mg | ORAL_TABLET | Freq: Two times a day (BID) | ORAL | 0 refills | Status: DC | PRN

## 2019-05-02 MED ORDER — METOCLOPRAMIDE HCL 5 MG/ML IJ SOLN
INTRAMUSCULAR | Status: AC
  Filled 2019-05-02: qty 2

## 2019-05-02 MED ORDER — DEXAMETHASONE SODIUM PHOSPHATE 10 MG/ML IJ SOLN: 10.0000 mg | Freq: Once | INTRAMUSCULAR | Status: DC

## 2019-05-02 MED ORDER — BUTALBITAL-APAP-CAFFEINE 50-325-40 MG PO TABS: 1.0000 | ORAL_TABLET | Freq: Four times a day (QID) | ORAL | 0 refills | Status: AC | PRN

## 2019-05-02 MED ORDER — DEXAMETHASONE SODIUM PHOSPHATE 10 MG/ML IJ SOLN
INTRAMUSCULAR | Status: AC
  Filled 2019-05-02: qty 1

## 2019-05-02 MED ORDER — IBUPROFEN 800 MG PO TABS: 800.0000 mg | ORAL_TABLET | Freq: Three times a day (TID) | ORAL | 0 refills | Status: DC

## 2019-05-02 MED ORDER — METOCLOPRAMIDE HCL 5 MG/ML IJ SOLN: 10.0000 mg | Freq: Once | INTRAMUSCULAR | Status: DC

## 2019-05-02 MED ORDER — KETOROLAC TROMETHAMINE 60 MG/2ML IM SOLN
INTRAMUSCULAR | Status: AC
  Filled 2019-05-02: qty 2
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
## 2019-05-02 NOTE — ED Notes (Signed)
No answer

## 2019-05-02 NOTE — Discharge Instructions (Addendum)
Take medications as directed. Alternate between ice and heat to affected areas at least three times a day.

## 2019-09-02 ENCOUNTER — Encounter: Payer: Self-pay | Admitting: General Practice

## 2019-09-25 ENCOUNTER — Ambulatory Visit: Payer: Medicaid Other | Admitting: Advanced Practice Midwife

## 2019-10-07 ENCOUNTER — Other Ambulatory Visit: Payer: Self-pay

## 2019-10-07 ENCOUNTER — Ambulatory Visit (INDEPENDENT_AMBULATORY_CARE_PROVIDER_SITE_OTHER): Payer: Medicaid Other

## 2019-10-07 ENCOUNTER — Ambulatory Visit (HOSPITAL_COMMUNITY)
Admission: EM | Admit: 2019-10-07 | Discharge: 2019-10-07 | Disposition: A | Discharge status: Home / Self Care | Payer: Medicaid Other | Attending: Family Medicine | Admitting: Family Medicine

## 2019-10-07 ENCOUNTER — Encounter (HOSPITAL_COMMUNITY): Payer: Self-pay

## 2019-10-07 DIAGNOSIS — M79671 Pain in right foot: Secondary | ICD-10-CM | POA: Diagnosis not present

## 2019-10-07 DIAGNOSIS — S99921A Unspecified injury of right foot, initial encounter: Secondary | ICD-10-CM | POA: Diagnosis not present

## 2019-10-07 DIAGNOSIS — S9031XA Contusion of right foot, initial encounter: Secondary | ICD-10-CM

## 2019-10-07 MED ORDER — DICLOFENAC SODIUM 75 MG PO TBEC: 75.0000 mg | DELAYED_RELEASE_TABLET | Freq: Two times a day (BID) | ORAL | 0 refills | Status: DC

## 2019-10-07 NOTE — ED Provider Notes (Signed)
[facility]   [id number] 10/07/19 Arrival Time: 1148  ASSESSMENT & PLAN:  1. Injury of right foot, initial encounter   2. Contusion of right foot, initial encounter     I have personally viewed the imaging studies ordered this visit. No foot fracture appreciated.  Placed in cam walker; WBAT. Ice and elevation.  Begin: Meds ordered this encounter  Medications  . diclofenac (VOLTAREN) 75 MG EC tablet    Sig: Take 1 tablet (75 mg total) by mouth 2 (two) times daily.    Dispense:  14 tablet    Refill:  0    Orders Placed This Encounter  Procedures  . DG Foot Complete Right  . Apply cam walker    Recommend: Follow-up Information    [facility].   Why: If worsening or failing to improve as anticipated. Contact information: [address] [place] [address] [phone]           Reviewed expectations re: course of current medical issues. Questions answered. Outlined signs and symptoms indicating need for more acute intervention. Patient verbalized understanding. After Visit Summary given.  SUBJECTIVE: History from: patient. Colleen Steele is a 19 y.o. female who reports persistent marked pain of her right dorsal foot; described as aching; without radiation. Onset: abrupt. First noted: yesterday. Injury/trama: reports fall on stairs landing on her right foot with immediate pain; able to bear weight since but with pain; swelling has improved slightly. Symptoms have progressed to a point and plateaued since beginning. Aggravating factors: certain movements and weight bearing. Alleviating factors: rest. Associated symptoms: none reported. Extremity sensation changes or weakness: "a little tingling I my toes". Self treatment: has not tried OTC therapies.  History of similar: no.  Past Surgical History:  Procedure Laterality Date  . MOUTH SURGERY    . NO PAST SURGERIES         OBJECTIVE:  Vitals:   10/07/19 1217 10/07/19 1218  BP: 112/71   Pulse: 75   Resp: 18   Temp: 98.1 F (36.7 C)   TempSrc: Oral   SpO2: 99%   Weight:  81.2 kg  Height:  5\' 2"  (1.575 m)    General appearance: alert; no distress HEENT: [place]; AT Neck: supple with FROM Resp: unlabored respirations Extremities: . RLE: warm with well perfused appearance; fairly well localized moderate tenderness over right dorsal/lateral foot; without gross deformities; swelling: moderate; bruising: none; ankle ROM: normal; is able to slightly wiggle toes CV: brisk extremity capillary refill of RLE Skin: warm and dry; no visible rashes Neurologic:  normal sensation of distal RLE Psychological: alert and cooperative; normal mood and affect  Imaging: DG Foot Complete Right  Result Date: 10/07/2019 CLINICAL DATA:  Right foot pain, swelling EXAM: RIGHT FOOT COMPLETE - 3+ VIEW COMPARISON:  None. FINDINGS: There is no evidence of fracture or dislocation. There is no evidence of arthropathy or other focal bone abnormality. Soft tissues are unremarkable. IMPRESSION: Negative. Electronically Signed   By: 10/09/2019 M.D.   On: 10/07/2019 12:36    No Known Allergies  Past Medical History:  Diagnosis Date  . Asthma   . Chlamydia   . Chronic kidney disease   . Diarrhea   . Environmental allergies   . Gonorrhea   . Vomiting    Social History   Socioeconomic History  . Marital status: Single    Spouse name: Not on file  . Number of children: Not on file  . Years  of education: 48  . Highest education level: 12th grade  Occupational History  . Occupation: Hotel manager: [employer]  Tobacco Use  . Smoking status: Never Smoker  . Smokeless tobacco: Never Used  Substance and Sexual Activity  . Alcohol use: Yes    Comment: occ  . Drug use: Not Currently    Types: Marijuana    Comment: Stopped 01/22/18  . Sexual activity: Yes    Birth control/protection: None  Other Topics  Concern  . Not on file  Social History Narrative   7th grade   Social Determinants of Health   Financial Resource Strain:   . Difficulty of Paying Living Expenses:   Food Insecurity:   . Worried About Charity fundraiser in the Last Year:   . Arboriculturist in the Last Year:   Transportation Needs:   . Film/video editor (Medical):   Marland Kitchen Lack of Transportation (Non-Medical):   Physical Activity:   . Days of Exercise per Week:   . Minutes of Exercise per Session:   Stress:   . Feeling of Stress :   Social Connections:   . Frequency of Communication with Friends and Family:   . Frequency of Social Gatherings with Friends and Family:   . Attends Religious Services:   . Active Member of Clubs or Organizations:   . Attends Archivist Meetings:   Marland Kitchen Marital Status:    Family History  Problem Relation Age of Onset  . Diverticulitis Sister   . Hypertension Mother    Past Surgical History:  Procedure Laterality Date  . MOUTH SURGERY    . NO PAST SURGERIES        Vanessa Kick, MD 10/07/19 1256

## 2019-10-07 NOTE — ED Triage Notes (Signed)
Pt states she walking down the steps and tripped over her daughter's safety gate and fell on her right leg, injuring her right foot. Pt was having difficulty w/ambulation coming to exam room. I had to wheel pt in wc. Pt has 2+ edema of right foot, 1+ right pedal pulse, pt able to wiggle toes slightly, cap refill less than 3 sec, foot warm to touch.

## 2019-10-09 ENCOUNTER — Ambulatory Visit: Payer: Medicaid Other | Admitting: Advanced Practice Midwife

## 2020-01-26 ENCOUNTER — Emergency Department: Payer: Medicaid Other

## 2020-01-26 ENCOUNTER — Other Ambulatory Visit: Payer: Self-pay

## 2020-01-26 ENCOUNTER — Encounter: Payer: Self-pay | Admitting: Emergency Medicine

## 2020-01-26 ENCOUNTER — Emergency Department
Admission: EM | Admit: 2020-01-26 | Discharge: 2020-01-26 | Disposition: A | Discharge status: Home / Self Care | Payer: Medicaid Other | Attending: Emergency Medicine | Admitting: Emergency Medicine

## 2020-01-26 DIAGNOSIS — N189 Chronic kidney disease, unspecified: Secondary | ICD-10-CM | POA: Insufficient documentation

## 2020-01-26 DIAGNOSIS — J45909 Unspecified asthma, uncomplicated: Secondary | ICD-10-CM | POA: Diagnosis not present

## 2020-01-26 DIAGNOSIS — R0789 Other chest pain: Secondary | ICD-10-CM | POA: Diagnosis not present

## 2020-01-26 DIAGNOSIS — R079 Chest pain, unspecified: Secondary | ICD-10-CM | POA: Diagnosis present

## 2020-01-26 LAB — CBC
HCT: 38.3 % (ref 36.0–46.0)
Hemoglobin: 13.4 g/dL (ref 12.0–15.0)
MCH: 31.6 pg (ref 26.0–34.0)
MCHC: 35 g/dL (ref 30.0–36.0)
MCV: 90.3 fL (ref 80.0–100.0)
Platelets: 223 10*3/uL (ref 150–400)
RBC: 4.24 MIL/uL (ref 3.87–5.11)
RDW: 12.2 % (ref 11.5–15.5)
WBC: 11.2 10*3/uL — ABNORMAL HIGH (ref 4.0–10.5)
nRBC: 0 % (ref 0.0–0.2)

## 2020-01-26 LAB — COMPREHENSIVE METABOLIC PANEL
ALT: 13 U/L (ref 0–44)
AST: 16 U/L (ref 15–41)
Albumin: 4.2 g/dL (ref 3.5–5.0)
Alkaline Phosphatase: 51 U/L (ref 38–126)
Anion gap: 7 (ref 5–15)
BUN: 12 mg/dL (ref 6–20)
CO2: 25 mmol/L (ref 22–32)
Calcium: 8.7 mg/dL — ABNORMAL LOW (ref 8.9–10.3)
Chloride: 106 mmol/L (ref 98–111)
Creatinine, Ser: 0.72 mg/dL (ref 0.44–1.00)
GFR calc Af Amer: >60 mL/min (ref >60)
GFR calc non Af Amer: >60 mL/min (ref >60)
Glucose, Bld: 90 mg/dL (ref 70–99)
Potassium: 4.3 mmol/L (ref 3.5–5.1)
Sodium: 138 mmol/L (ref 135–145)
Total Bilirubin: 0.8 mg/dL (ref 0.3–1.2)
Total Protein: 7.3 g/dL (ref 6.5–8.1)

## 2020-01-26 LAB — TROPONIN I (HIGH SENSITIVITY): Troponin I (High Sensitivity): <2 ng/L (ref <18)

## 2020-01-26 MED ORDER — CYCLOBENZAPRINE HCL 7.5 MG PO TABS: 7.5000 mg | ORAL_TABLET | Freq: Three times a day (TID) | ORAL | 0 refills | Status: DC | PRN

## 2020-01-26 MED ORDER — CYCLOBENZAPRINE HCL 5 MG PO TABS
7.5000 mg | ORAL_TABLET | Freq: Once | ORAL | Status: AC
  Administered 2020-01-26: 7.5 mg via ORAL
  Filled 2020-01-26 (×2): qty 1.5

## 2020-01-26 MED ORDER — IBUPROFEN 600 MG PO TABS
600.0000 mg | ORAL_TABLET | ORAL | Status: AC
  Administered 2020-01-26: 600 mg via ORAL
  Filled 2020-01-26: qty 1

## 2020-01-26 NOTE — ED Notes (Signed)
Pt given saltine crackers and water with approval from Dr. Fanny Bien, MD

## 2020-01-26 NOTE — ED Triage Notes (Signed)
Pt to ER with c/o 2 weeks of intermittent chest tightness and left arm cramping.  Pt reports death of grandfather on January 23, 2023.  Pt also reports she is in school.  Pt denies SHOB, n/v or other associated s/s.  Pt denies SI/HI

## 2020-01-26 NOTE — ED Provider Notes (Addendum)
[facility] Emergency Department Provider Note   ____________________________________________   First MD Initiated Contact with Patient 01/26/20 1015     (approximate)  I have reviewed the triage vital signs and the nursing notes.   HISTORY  Chief Complaint Arm Pain and Chest Pain    HPI Colleen Steele is a 19 y.o. female for evaluation of 2 weeks of the tightness and cramping in the left upper chest that was sometimes under her left arm  Patient reports that she has been having episodes of pain usually very sharp in her left upper chest that extends into her left arm and she can feel her muscles oftentimes feel tight.  Is worsened by positioning, sometimes worse with laying on the bed.  Today while laying family members bed she experienced this tightness and pain prompting her to call EMS.  The symptoms have since improved but she feels sore along the left side of her breastbone.  Does not take any birth control.  No history of blood clots.  No leg swelling.  No recent long trips or travel.  No recent trauma.  No leg swelling.  Does not smoke.  Has a family history of cardiac disease and elderly grandfather in his 27s  No nausea vomiting.  Denies pregnancy.  No shortness of breath.  Patient reports she thinks it may be related to anxiety, she lost her grandfather about 2 or 3 weeks ago, and also works a job, raises her child, and is also in school as well   Past Medical History:  Diagnosis Date  . Asthma   . Chlamydia   . Chronic kidney disease   . Diarrhea   . Environmental allergies   . Gonorrhea   . Vomiting     Patient Active Problem List   Diagnosis Date Noted  . Migraine headache 05/22/2018  . Trichomonal infection 04/20/2018  . Gonorrhea 04/20/2018  . Chlamydia 04/20/2018    Past Surgical History:  Procedure Laterality Date  . MOUTH SURGERY    . NO PAST SURGERIES      Prior to Admission medications   Medication Sig Start  Date End Date Taking? Authorizing Provider  butalbital-acetaminophen-caffeine (FIORICET) 50-325-40 MG tablet Take 1 tablet by mouth every 6 (six) hours as needed for headache. 05/02/19 05/01/20  Lurline Idol, FNP  cyclobenzaprine (FEXMID) 7.5 MG tablet Take 1 tablet (7.5 mg total) by mouth 3 (three) times daily as needed for muscle spasms. 01/26/20   Sharyn Creamer, MD  diclofenac (VOLTAREN) 75 MG EC tablet Take 1 tablet (75 mg total) by mouth 2 (two) times daily. 10/07/19   Mardella Layman, MD  ferrous sulfate (FERROUSUL) 325 (65 FE) MG tablet Take 1 tablet (325 mg total) by mouth daily with breakfast. Patient not taking: Reported on 09/18/2018 07/02/18 05/02/19  [name], CNM    Allergies Patient has no known allergies.  Family History  Problem Relation Age of Onset  . Diverticulitis Sister   . Hypertension Mother     Social History Social History   Tobacco Use  . Smoking status: Never Smoker  . Smokeless tobacco: Never Used  Vaping Use  . Vaping Use: Never used  Substance Use Topics  . Alcohol use: Yes    Comment: occ  . Drug use: Not Currently    Types: Marijuana    Comment: Stopped 01/22/18    Review of Systems Constitutional: No fever/chills Eyes: No visual changes. ENT: No sore throat. Cardiovascular: See HPI Respiratory: Denies shortness of breath.  Gastrointestinal: No abdominal pain.   Genitourinary: Negative for dysuria. Musculoskeletal: Negative for back pain.  Pain in her left upper chest often radiating some pain into her left upper arm feeling like a tensing or tightening in the muscles often sharp in nature Skin: Negative for rash. Neurological: Negative for headaches and no numbness or weakness.    ____________________________________________   PHYSICAL EXAM:  VITAL SIGNS: ED Triage Vitals  Enc Vitals Group     BP 01/26/20 0907 (!) 133/97     Pulse Rate 01/26/20 0907 80     Resp 01/26/20 0907 18     Temp 01/26/20 0907 98.2 F (36.8 C)     Temp  Source 01/26/20 0907 Oral     SpO2 01/26/20 0907 99 %     Weight 01/26/20 0909 179 lb (81.2 kg)     Height 01/26/20 0909 5\' 2"  (1.575 m)     Head Circumference --      Peak Flow --      Pain Score 01/26/20 0908 4     Pain Loc --      Pain Edu? --      Excl. in GC? --     Constitutional: Alert and oriented. Well appearing and in no acute distress. Eyes: Conjunctivae are normal. Head: Atraumatic. Nose: No congestion/rhinnorhea. Mouth/Throat: Mucous membranes are moist. Neck: No stridor.  Cardiovascular: Normal rate, regular rhythm. Grossly normal heart sounds.  Good peripheral circulation.  Some tenderness to palpation along the left costal margin all also some along the left upper chest wall pectoralis region.  Patient reports pain is reproduced with pushing over the breastbone Respiratory: Normal respiratory effort.  No retractions. Lungs CTAB. Gastrointestinal: Soft and nontender. No distention. Musculoskeletal: No lower extremity tenderness nor edema. Neurologic:  Normal speech and language. No gross focal neurologic deficits are appreciated.  Skin:  Skin is warm, dry and intact. No rash noted. Psychiatric: Mood and affect are normal. Speech and behavior are normal.  ____________________________________________   LABS (all labs ordered are listed, but only abnormal results are displayed)  Labs Reviewed  CBC - Abnormal; Notable for the following components:      Result Value   WBC 11.2 (*)    All other components within normal limits  COMPREHENSIVE METABOLIC PANEL - Abnormal; Notable for the following components:   Calcium 8.7 (*)    All other components within normal limits  TROPONIN I (HIGH SENSITIVITY)   ____________________________________________  EKG  ED ECG REPORT I, 03/28/20, the attending physician, personally viewed and interpreted this ECG.  Date: 01/26/2020 EKG Time: 915 Rate: 55 Rhythm: normal sinus rhythm with sinus arrhythmia QRS Axis:  normal Intervals: normal ST/T Wave abnormalities: normal Narrative Interpretation: no evidence of acute ischemia  ____________________________________________  RADIOLOGY  DG Chest 2 View  Result Date: 01/26/2020 CLINICAL DATA:  Left upper chest pain for 2 weeks EXAM: CHEST - 2 VIEW COMPARISON:  12/14/2012 FINDINGS: The heart size and mediastinal contours are within normal limits. Both lungs are clear. The visualized skeletal structures are unremarkable. IMPRESSION: No acute abnormality of the lungs. Electronically Signed   By: 12/16/2012 M.D.   On: 01/26/2020 10:55    Chest x-ray reviewed, normal chest x-ray ____________________________________________   PROCEDURES  Procedure(s) performed: None  Procedures  Critical Care performed: No  ____________________________________________   INITIAL IMPRESSION / ASSESSMENT AND PLAN / ED COURSE  Pertinent labs & imaging results that were available during my care of the patient were reviewed by me and  considered in my medical decision making (see chart for details).   Differential diagnosis includes, but is not limited to, ACS, aortic dissection, pulmonary embolism, cardiac tamponade, pneumothorax, pneumonia, pericarditis, myocarditis, GI-related causes including esophagitis/gastritis, and musculoskeletal chest wall pain.    Reproducible pain.  Low risk.  Very minimal if any cardiac risk factors.  Reassuring EKG and troponin.  Symptoms going for about 2 weeks with normal troponin.  Not consistent with cardiac disease.  Will PERC negative for PE risk.  Normal chest x-ray no infectious symptoms.  ----------------------------------------- 2:33 PM on 01/26/2020 -----------------------------------------  After providing muscle relaxant and ibuprofen patient reports her symptoms feel quite a bit better.  Discussed no driving while taking Flexeril, discussed common side effects including drowsiness and fatigue.  She is comfortable with plan  to trial muscle relaxant, NSAIDs, follow-up with primary doctor.  Return precautions and treatment recommendations and follow-up discussed with the patient who is agreeable with the plan.  Family coming to pick her up and drive her home    HEAR Score: 1     ____________________________________________   FINAL CLINICAL IMPRESSION(S) / ED DIAGNOSES  Final diagnoses:  Chest wall pain  Chest pain with low risk for cardiac etiology        Note:  This document was prepared using Dragon voice recognition software and may include unintentional dictation errors       Sharyn Creamer, MD 01/26/20 1434    Sharyn Creamer, MD 01/26/20 1434

## 2020-01-26 NOTE — ED Notes (Signed)
Pt presents to the ED for CP and L arm pain. Pt states that it has been going for about 2 weeks on and off. Pt states she has had a lot stressors in her life recently and this may be related to that. Pt is A&Ox4 and NAD at this time. Pt NSR on the cardiac monitor and V/S WNL.

## 2020-01-26 NOTE — ED Triage Notes (Signed)
First RN Note: pt presents to ED via [facility] with c/o anxiety. Per EMS, initially called out for CP however reports recently lost her grandfather. Per EMS recently seen and dx with dehydration, pt reports not drinking water appropriately.     74NSR 149/92 100% RA

## 2020-03-29 ENCOUNTER — Emergency Department
Admission: EM | Admit: 2020-03-29 | Discharge: 2020-03-29 | Disposition: A | Discharge status: Other Acute Inpatient Hospital | Payer: Medicaid Other

## 2021-03-05 ENCOUNTER — Emergency Department
Admission: EM | Admit: 2021-03-05 | Discharge: 2021-03-05 | Disposition: A | Discharge status: Home / Self Care | Payer: Medicaid Other | Attending: Emergency Medicine | Admitting: Emergency Medicine

## 2021-03-05 ENCOUNTER — Encounter: Payer: Self-pay | Admitting: Physician Assistant

## 2021-03-05 ENCOUNTER — Other Ambulatory Visit: Payer: Self-pay

## 2021-03-05 DIAGNOSIS — A749 Chlamydial infection, unspecified: Secondary | ICD-10-CM

## 2021-03-05 DIAGNOSIS — R110 Nausea: Secondary | ICD-10-CM | POA: Insufficient documentation

## 2021-03-05 DIAGNOSIS — B9689 Other specified bacterial agents as the cause of diseases classified elsewhere: Secondary | ICD-10-CM | POA: Diagnosis not present

## 2021-03-05 DIAGNOSIS — O26899 Other specified pregnancy related conditions, unspecified trimester: Secondary | ICD-10-CM | POA: Insufficient documentation

## 2021-03-05 DIAGNOSIS — O2310 Infections of bladder in pregnancy, unspecified trimester: Secondary | ICD-10-CM | POA: Diagnosis not present

## 2021-03-05 DIAGNOSIS — A599 Trichomoniasis, unspecified: Secondary | ICD-10-CM | POA: Diagnosis not present

## 2021-03-05 DIAGNOSIS — J45909 Unspecified asthma, uncomplicated: Secondary | ICD-10-CM | POA: Diagnosis not present

## 2021-03-05 DIAGNOSIS — O219 Vomiting of pregnancy, unspecified: Secondary | ICD-10-CM

## 2021-03-05 DIAGNOSIS — N189 Chronic kidney disease, unspecified: Secondary | ICD-10-CM | POA: Insufficient documentation

## 2021-03-05 DIAGNOSIS — Z3201 Encounter for pregnancy test, result positive: Secondary | ICD-10-CM

## 2021-03-05 DIAGNOSIS — Z3A00 Weeks of gestation of pregnancy not specified: Secondary | ICD-10-CM | POA: Insufficient documentation

## 2021-03-05 DIAGNOSIS — N760 Acute vaginitis: Secondary | ICD-10-CM | POA: Diagnosis not present

## 2021-03-05 LAB — WET PREP, GENITAL
Sperm: NONE SEEN
Yeast Wet Prep HPF POC: NONE SEEN

## 2021-03-05 LAB — URINALYSIS, COMPLETE (UACMP) WITH MICROSCOPIC
Bacteria, UA: NONE SEEN
Bilirubin Urine: NEGATIVE
Glucose, UA: NEGATIVE mg/dL
Hgb urine dipstick: NEGATIVE
Ketones, ur: 20 mg/dL — AB
Nitrite: NEGATIVE
Protein, ur: NEGATIVE mg/dL
Specific Gravity, Urine: 1.026 (ref 1.005–1.030)
pH: 8 (ref 5.0–8.0)

## 2021-03-05 LAB — POC URINE PREG, ED: Preg Test, Ur: POSITIVE — AB

## 2021-03-05 LAB — CHLAMYDIA/NGC RT PCR (ARMC ONLY)
Chlamydia Tr: DETECTED — AB
N gonorrhoeae: NOT DETECTED

## 2021-03-05 MED ORDER — METRONIDAZOLE 500 MG PO TABS: 500.0000 mg | ORAL_TABLET | Freq: Two times a day (BID) | ORAL | 0 refills | Status: AC

## 2021-03-05 MED ORDER — METOCLOPRAMIDE HCL 5 MG PO TABS: 5.0000 mg | ORAL_TABLET | Freq: Three times a day (TID) | ORAL | 0 refills | Status: DC | PRN

## 2021-03-05 MED ORDER — AZITHROMYCIN 250 MG PO TABS: 1000.0000 mg | ORAL_TABLET | Freq: Once | ORAL | 0 refills | Status: AC

## 2021-03-05 MED ORDER — METRONIDAZOLE 500 MG PO TABS
500.0000 mg | ORAL_TABLET | Freq: Once | ORAL | Status: AC
  Administered 2021-03-05: 500 mg via ORAL
  Filled 2021-03-05: qty 1

## 2021-03-05 MED ORDER — COMPLETENATE 29-1 MG PO CHEW: 1.0000 | CHEWABLE_TABLET | Freq: Every day | ORAL | 2 refills | Status: AC

## 2021-03-05 MED ORDER — METOCLOPRAMIDE HCL 10 MG PO TABS
10.0000 mg | ORAL_TABLET | Freq: Once | ORAL | Status: AC
  Administered 2021-03-05: 10 mg via ORAL
  Filled 2021-03-05: qty 1

## 2021-03-05 NOTE — ED Provider Notes (Signed)
[facility] Emergency Department Provider Note  ___________________________________________   Event Date/Time   First MD Initiated Contact with Patient 03/05/21 1515     (approximate)  I have reviewed the triage vital signs and the nursing notes.   HISTORY  Chief Complaint Nausea and Abdominal Cramping  HPI Colleen Steele is a 20 y.o. female G3P1, presents her self to the ED for evaluation of nausea and cramping.  Patient reports she found out yesterday that she was pregnant.  She reports her LMP was 01/25/2021.  She denies any fever, chills, sweats, chest pain, or shortness of breath.  Past Medical History:  Diagnosis Date   Asthma    Chlamydia    Chronic kidney disease    Diarrhea    Environmental allergies    Gonorrhea    Vomiting     Patient Active Problem List   Diagnosis Date Noted   Migraine headache 05/22/2018   Trichomonal infection 04/20/2018   Gonorrhea 04/20/2018   Chlamydia 04/20/2018    Past Surgical History:  Procedure Laterality Date   MOUTH SURGERY     NO PAST SURGERIES      Prior to Admission medications   Medication Sig Start Date End Date Taking? Authorizing Provider  azithromycin (ZITHROMAX Z-PAK) 250 MG tablet Take 4 tablets (1,000 mg total) by mouth once for 1 dose. 03/05/21 03/05/21 Yes Kyandra Mcclaine, Charlesetta Ivory, PA-C  metoCLOPramide (REGLAN) 5 MG tablet Take 1-2 tablets (5-10 mg total) by mouth every 8 (eight) hours as needed for nausea or vomiting. 03/05/21  Yes Ivylynn Hoppes, Charlesetta Ivory, PA-C  metroNIDAZOLE (FLAGYL) 500 MG tablet Take 1 tablet (500 mg total) by mouth 2 (two) times daily for 7 days. 03/05/21 03/12/21 Yes Cassell Voorhies, Charlesetta Ivory, PA-C  prenatal vitamin w/FE, FA (NATACHEW) 29-1 MG CHEW chewable tablet Chew 1 tablet by mouth daily at 12 noon. 03/05/21 06/03/21 Yes Yarisa Lynam, Charlesetta Ivory, PA-C  ferrous sulfate (FERROUSUL) 325 (65 FE) MG tablet Take 1 tablet (325 mg total) by mouth daily with breakfast. Patient  not taking: Reported on 09/18/2018 07/02/18 05/02/19  [name], CNM    Allergies Patient has no known allergies.  Family History  Problem Relation Age of Onset   Diverticulitis Sister    Hypertension Mother     Social History Social History   Tobacco Use   Smoking status: Never   Smokeless tobacco: Never  Vaping Use   Vaping Use: Never used  Substance Use Topics   Alcohol use: Yes    Comment: occ   Drug use: Not Currently    Types: Marijuana    Comment: Stopped 01/22/18    Review of Systems  Constitutional: No fever/chills Eyes: No visual changes. ENT: No sore throat. Cardiovascular: Denies chest pain. Respiratory: Denies shortness of breath. Gastrointestinal: No abdominal pain.  Reports nausea, no vomiting.  No diarrhea.  No constipation. Genitourinary: Negative for dysuria.  Reports pelvic cramping.  Denies any vaginal bleeding. Musculoskeletal: Negative for back pain. Skin: Negative for rash. Neurological: Negative for headaches, focal weakness or numbness. ____________________________________________   PHYSICAL EXAM:  VITAL SIGNS: ED Triage Vitals [03/05/21 1431]  Enc Vitals Group     BP (!) 133/99     Pulse Rate 69     Resp 18     Temp 98.7 F (37.1 C)     Temp Source Oral     SpO2 98 %     Weight 165 lb (74.8 kg)     Height 5\' 2"  (  1.575 m)     Head Circumference      Peak Flow      Pain Score 8     Pain Loc      Pain Edu?      Excl. in GC?     Constitutional: Alert and oriented. Well appearing and in no acute distress. Eyes: Conjunctivae are normal. PERRL. EOMI. Head: Atraumatic. Nose: No congestion/rhinnorhea. Mouth/Throat: Mucous membranes are moist.  Oropharynx non-erythematous. Neck: No stridor.   Cardiovascular: Normal rate, regular rhythm. Grossly normal heart sounds.  Good peripheral circulation. Respiratory: Normal respiratory effort.  No retractions. Lungs CTAB. Gastrointestinal: Soft and nontender. No distention. No abdominal  bruits. No CVA tenderness. Genitourinary: Normal external genitalia.  Patient with some scant white discharge noted in the vault.  No CMT or adnexal masses appreciated. Musculoskeletal: No lower extremity tenderness nor edema.  No joint effusions. Neurologic:  Normal speech and language. No gross focal neurologic deficits are appreciated. No gait instability. Skin:  Skin is warm, dry and intact. No rash noted. Psychiatric: Mood and affect are normal. Speech and behavior are normal.  ____________________________________________   LABS (all labs ordered are listed, but only abnormal results are displayed)  Labs Reviewed  CHLAMYDIA/NGC RT PCR ([facility] ONLY)           - Abnormal; Notable for the following components:      Result Value   Chlamydia Tr DETECTED (*)    All other components within normal limits  WET PREP, GENITAL - Abnormal; Notable for the following components:   Trich, Wet Prep PRESENT (*)    Clue Cells Wet Prep HPF POC PRESENT (*)    WBC, Wet Prep HPF POC PRESENT (*)    All other components within normal limits  URINALYSIS, COMPLETE (UACMP) WITH MICROSCOPIC - Abnormal; Notable for the following components:   Color, Urine YELLOW (*)    APPearance HAZY (*)    Ketones, ur 20 (*)    Leukocytes,Ua MODERATE (*)    All other components within normal limits  POC URINE PREG, ED - Abnormal; Notable for the following components:   Preg Test, Ur POSITIVE (*)    All other components within normal limits   ____________________________________________  EKG  ____________________________________________  RADIOLOGY  I, Lissa Hoard, personally viewed and evaluated these images (plain radiographs) as part of my medical decision making, as well as reviewing the written report by the radiologist.  ED MD interpretation:    Official radiology report(s): No results found.  ____________________________________________   PROCEDURES  Procedure(s) performed (including  Critical Care):  Procedures  Reglan 10 mg p.o. Metronidazole 500 mg p.o. ____________________________________________   INITIAL IMPRESSION / ASSESSMENT AND PLAN / ED COURSE  As part of my medical decision making, I reviewed the following data within the electronic MEDICAL RECORD NUMBER Labs reviewed as above and Notes from prior ED visits  DDX: vaginitis, gonorrhea, chlamydia, UTI  Female patient G3P1, with a recently confirmed pregnancy, presents to the ED for evaluation of symptoms.  She was treated for hyperemesis, and routine STD screening confirmed BV, trichomoniasis, and chlamydia.  Patient was started on metronidazole as the GC culture was not available at the time she decided to discharge from the ED.  She will be notified via phone that a secondary prescription available at her pharmacy for azithromycin is awaiting her pickup.  She is advised that her partner should be treated before they engage in any intimate activity.  We will follow-up with her planned [facility] provider  for routine prenatal care. ____________________________________________   FINAL CLINICAL IMPRESSION(S) / ED DIAGNOSES  Final diagnoses:  Positive pregnancy test  Nausea and vomiting during pregnancy  Trichimoniasis  BV (bacterial vaginosis)  Chlamydia     ED Discharge Orders          Ordered    metoCLOPramide (REGLAN) 5 MG tablet  Every 8 hours PRN        03/05/21 1613    prenatal vitamin w/FE, FA (NATACHEW) 29-1 MG CHEW chewable tablet  Daily        03/05/21 1613    metroNIDAZOLE (FLAGYL) 500 MG tablet  2 times daily        03/05/21 1716    azithromycin (ZITHROMAX Z-PAK) 250 MG tablet   Once        03/05/21 1843             Note:  This document was prepared using Dragon voice recognition software and may include unintentional dictation errors.    Lissa Hoard, PA-C 03/05/21 1845    Gilles Chiquito, MD 03/05/21 2049

## 2021-03-05 NOTE — ED Notes (Signed)
Pt provided cup of ice as requested

## 2021-03-05 NOTE — Discharge Instructions (Signed)
You are being treated for your vaginitis with metronidazole. Trichomoniasis is considered a STD, but BV is not.  Your partner should also be treated, even if he doesn't have symptoms. Follow-up with your OB provider for ongoing prenatal care.

## 2021-03-05 NOTE — ED Triage Notes (Signed)
Pt here with nausea and cramping. Pt states that she just found out that she was pregnant yesterday. Pt last menstrual was 01/25/21.

## 2021-03-05 NOTE — ED Notes (Signed)
Pt declined d/c VS. Denies questions or concerns regarding d/c papers.

## 2021-03-05 NOTE — ED Notes (Signed)
See triage note  Presents with some n/v and some abd cramping  Recently found out she was pregnant  Denies any vaginal bleeding

## 2021-03-08 ENCOUNTER — Ambulatory Visit: Payer: Self-pay | Admitting: *Deleted

## 2021-03-08 NOTE — Telephone Encounter (Signed)
  Pt reports [redacted] weeks pregnant. States severe lower abdomen cramping yesterday. States comes and goes today. No bleeding. Does not have OB/GYN. Advised UC. States will follow disposition.  No PCP     Answer Assessment - Initial Assessment Questions 1. LOCATION: "Where does it hurt?"      Lower abdomen 2. RADIATION: "Does the pain shoot anywhere else?" (e.g., chest, back, shoulder)     no 3. ONSET: "When did the pain begin?" (e.g., minutes, hours or days ago)       4. ONSET: "Gradual or sudden onset?"     *No Answer* 5. PATTERN "Does the pain come and go, or has it been constant since it started?"      *No Answer* 6. SEVERITY: "How bad is the pain?" "What does it keep you from doing?"  (e.g., Scale 1-10; mild, moderate, or severe)   - MILD (1-3): doesn't interfere with normal activities, abdomen soft and not tender to touch    - MODERATE (4-7): interferes with normal activities or awakens from sleep, abdomen tender to touch    - SEVERE (8-10): excruciating pain, doubled over, unable to do any normal activities     Severe at times 7. RECURRENT SYMPTOM: "Have you ever had this type of stomach pain before?" If Yes, ask: "When was the last time?" and "What happened that time?"       8. CAUSE: "What do you think is causing the stomach pain?"     Unsure 9. RELIEVING/AGGRAVATING FACTORS: "What makes it better or worse?" (e.g., antacids, bowel movement, movement)      10. OTHER SYMPTOMS:"Do you have any other symptoms?" (e.g., back pain, diarrhea, fever, urination pain, vaginal discharge, vomiting)       No 11. EDD: "What date are you expecting to deliver?"  Protocols used: Pregnancy - Abdominal Pain Less Than [redacted] Weeks EGA-A-AH

## 2021-07-25 NOTE — L&D Delivery Note (Signed)
Delivery Note ? ?Colleen Steele is a G3P1011 at [redacted]w[redacted]d with an LMP of 01/27/22, consistent with [place] at [redacted]w[redacted]d.  ? ?First Stage: ?Labor onset: 2057 ?Induction: oxytocin, AROM, and cervical balloon ?Analgesia /Anesthesia intrapartum: epidural ?AROM at 2101 ?GBS: negative ?IP Antibiotics: N/A ? ?Second Stage: ?Complete dilation at 0005 ?Onset of pushing at 0006 ?FHR second stage category 2 with recurrent variables and moderate variability  ? ?Delivery of a viable baby boy on 10/21/2021 at [place] by CNM ?Delivery of fetal head in OA position with restitution to LOT. ?no nuchal cord;  Anterior then posterior shoulders delivered easily with gentle downward traction. Baby placed on mom's chest, and attended to by baby RN ?Cord double clamped after cessation of pulsation, cut by FOB ? ?Cord blood sample collection: Yes O POS ?Collection of cord blood donation N/A ?Arterial cord blood sample  N/A ? ?Third Stage: ?Oxytocin bolus started after delivery of infant for hemorrhage prophylaxis  ?Placenta delivered [name] intact with 3 VC @ 0013 ?Placenta disposition: discarded per protocol ?Uterine tone firm / bleeding small ? ?no laceration identified  ?Anesthesia for repair: none ?Repair N/A ?Est. Blood Loss (mL): 250 ? ?Complications: none ? ?Mom to postpartum.  Baby to Couplet care / Skin to Skin. ? ?Newborn: ?Information for the patient's newborn:  Lyrika, Adornetto [id number]  ?Live born female  ?Birth Weight:  7.2 lbs ?APGAR: 7, 9 ? ?Newborn Delivery   ?Birth date/time: 10/[birth year]21 00:08:00 ?Delivery type: Vaginal, Spontaneous ?  ?  ?  ? ?Feeding planned: Breast ? ?---------- ?Avelino Leeds, CNM ?Certified Nurse Midwife ?[facility] ?[facility]   ?

## 2021-08-08 ENCOUNTER — Encounter: Payer: Self-pay | Admitting: Emergency Medicine

## 2021-08-08 ENCOUNTER — Other Ambulatory Visit: Payer: Self-pay

## 2021-08-08 ENCOUNTER — Observation Stay
Admission: EM | Admit: 2021-08-08 | Discharge: 2021-08-08 | Disposition: A | Discharge status: Home / Self Care | Payer: Medicaid Other | Attending: Certified Nurse Midwife | Admitting: Certified Nurse Midwife

## 2021-08-08 DIAGNOSIS — J45909 Unspecified asthma, uncomplicated: Secondary | ICD-10-CM | POA: Insufficient documentation

## 2021-08-08 DIAGNOSIS — Z3A27 27 weeks gestation of pregnancy: Secondary | ICD-10-CM | POA: Insufficient documentation

## 2021-08-08 DIAGNOSIS — O99512 Diseases of the respiratory system complicating pregnancy, second trimester: Secondary | ICD-10-CM | POA: Insufficient documentation

## 2021-08-08 DIAGNOSIS — O219 Vomiting of pregnancy, unspecified: Secondary | ICD-10-CM

## 2021-08-08 DIAGNOSIS — Z91199 Patient's noncompliance with other medical treatment and regimen due to unspecified reason: Secondary | ICD-10-CM

## 2021-08-08 DIAGNOSIS — O26892 Other specified pregnancy related conditions, second trimester: Secondary | ICD-10-CM | POA: Diagnosis not present

## 2021-08-08 DIAGNOSIS — O212 Late vomiting of pregnancy: Principal | ICD-10-CM | POA: Insufficient documentation

## 2021-08-08 DIAGNOSIS — O368120 Decreased fetal movements, second trimester, not applicable or unspecified: Secondary | ICD-10-CM | POA: Diagnosis not present

## 2021-08-08 DIAGNOSIS — N189 Chronic kidney disease, unspecified: Secondary | ICD-10-CM | POA: Insufficient documentation

## 2021-08-08 DIAGNOSIS — I129 Hypertensive chronic kidney disease with stage 1 through stage 4 chronic kidney disease, or unspecified chronic kidney disease: Secondary | ICD-10-CM | POA: Insufficient documentation

## 2021-08-08 DIAGNOSIS — O368190 Decreased fetal movements, unspecified trimester, not applicable or unspecified: Secondary | ICD-10-CM | POA: Diagnosis present

## 2021-08-08 DIAGNOSIS — R519 Headache, unspecified: Secondary | ICD-10-CM | POA: Insufficient documentation

## 2021-08-08 DIAGNOSIS — Z20822 Contact with and (suspected) exposure to covid-19: Secondary | ICD-10-CM | POA: Insufficient documentation

## 2021-08-08 DIAGNOSIS — O10212 Pre-existing hypertensive chronic kidney disease complicating pregnancy, second trimester: Secondary | ICD-10-CM | POA: Diagnosis not present

## 2021-08-08 DIAGNOSIS — E162 Hypoglycemia, unspecified: Secondary | ICD-10-CM

## 2021-08-08 HISTORY — DX: Essential (primary) hypertension: I10

## 2021-08-08 LAB — URINALYSIS, ROUTINE W REFLEX MICROSCOPIC
Bilirubin Urine: NEGATIVE
Glucose, UA: NEGATIVE mg/dL
Hgb urine dipstick: NEGATIVE
Ketones, ur: 80 mg/dL — AB
Leukocytes,Ua: NEGATIVE
Nitrite: NEGATIVE
Specific Gravity, Urine: 1.025 (ref 1.005–1.030)
pH: 7 (ref 5.0–8.0)

## 2021-08-08 LAB — COMPREHENSIVE METABOLIC PANEL
ALT: 16 U/L (ref 0–44)
AST: 19 U/L (ref 15–41)
Albumin: 3.2 g/dL — ABNORMAL LOW (ref 3.5–5.0)
Alkaline Phosphatase: 73 U/L (ref 38–126)
Anion gap: 8 (ref 5–15)
BUN: <5 mg/dL — ABNORMAL LOW (ref 6–20)
CO2: 21 mmol/L — ABNORMAL LOW (ref 22–32)
Calcium: 8.1 mg/dL — ABNORMAL LOW (ref 8.9–10.3)
Chloride: 105 mmol/L (ref 98–111)
Creatinine, Ser: 0.48 mg/dL (ref 0.44–1.00)
GFR, Estimated: >60 mL/min (ref >60)
Glucose, Bld: 76 mg/dL (ref 70–99)
Potassium: 3.2 mmol/L — ABNORMAL LOW (ref 3.5–5.1)
Sodium: 134 mmol/L — ABNORMAL LOW (ref 135–145)
Total Bilirubin: 0.7 mg/dL (ref 0.3–1.2)
Total Protein: 6.4 g/dL — ABNORMAL LOW (ref 6.5–8.1)

## 2021-08-08 LAB — BASIC METABOLIC PANEL
Anion gap: 7 (ref 5–15)
BUN: 6 mg/dL (ref 6–20)
CO2: 23 mmol/L (ref 22–32)
Calcium: 8.4 mg/dL — ABNORMAL LOW (ref 8.9–10.3)
Chloride: 105 mmol/L (ref 98–111)
Creatinine, Ser: 0.49 mg/dL (ref 0.44–1.00)
GFR, Estimated: >60 mL/min (ref >60)
Glucose, Bld: 69 mg/dL — ABNORMAL LOW (ref 70–99)
Potassium: 3.7 mmol/L (ref 3.5–5.1)
Sodium: 135 mmol/L (ref 135–145)

## 2021-08-08 LAB — CBC WITH DIFFERENTIAL/PLATELET
Abs Immature Granulocytes: 0.1 10*3/uL — ABNORMAL HIGH (ref 0.00–0.07)
Basophils Absolute: 0 10*3/uL (ref 0.0–0.1)
Basophils Relative: 0 %
Eosinophils Absolute: 0.1 10*3/uL (ref 0.0–0.5)
Eosinophils Relative: 1 %
HCT: 35.3 % — ABNORMAL LOW (ref 36.0–46.0)
Hemoglobin: 11.8 g/dL — ABNORMAL LOW (ref 12.0–15.0)
Immature Granulocytes: 1 %
Lymphocytes Relative: 17 %
Lymphs Abs: 2.4 10*3/uL (ref 0.7–4.0)
MCH: 31.1 pg (ref 26.0–34.0)
MCHC: 33.4 g/dL (ref 30.0–36.0)
MCV: 92.9 fL (ref 80.0–100.0)
Monocytes Absolute: 1 10*3/uL (ref 0.1–1.0)
Monocytes Relative: 7 %
Neutro Abs: 10.6 10*3/uL — ABNORMAL HIGH (ref 1.7–7.7)
Neutrophils Relative %: 74 %
Platelets: 212 10*3/uL (ref 150–400)
RBC: 3.8 MIL/uL — ABNORMAL LOW (ref 3.87–5.11)
RDW: 12.4 % (ref 11.5–15.5)
WBC: 14.2 10*3/uL — ABNORMAL HIGH (ref 4.0–10.5)
nRBC: 0 % (ref 0.0–0.2)

## 2021-08-08 LAB — URINALYSIS, MICROSCOPIC (REFLEX): Bacteria, UA: NONE SEEN

## 2021-08-08 LAB — RESP PANEL BY RT-PCR (FLU A&B, COVID) ARPGX2
Influenza A by PCR: NEGATIVE
Influenza B by PCR: NEGATIVE
SARS Coronavirus 2 by RT PCR: NEGATIVE

## 2021-08-08 LAB — PROTEIN / CREATININE RATIO, URINE
Creatinine, Urine: 208 mg/dL
Protein Creatinine Ratio: 0.09 mg/mg{Cre} (ref 0.00–0.15)
Total Protein, Urine: 18 mg/dL

## 2021-08-08 LAB — CBG MONITORING, ED: Glucose-Capillary: 65 mg/dL — ABNORMAL LOW (ref 70–99)

## 2021-08-08 MED ORDER — ACETAMINOPHEN 325 MG PO TABS: 650.0000 mg | ORAL_TABLET | ORAL | Status: DC | PRN

## 2021-08-08 MED ORDER — CALCIUM CARBONATE ANTACID 500 MG PO CHEW: 2.0000 | CHEWABLE_TABLET | ORAL | Status: DC | PRN

## 2021-08-08 MED ORDER — LACTATED RINGERS IV BOLUS
1000.0000 mL | Freq: Once | INTRAVENOUS | Status: AC
  Administered 2021-08-08: 1000 mL via INTRAVENOUS

## 2021-08-08 MED ORDER — ONDANSETRON HCL 4 MG/2ML IJ SOLN
4.0000 mg | Freq: Once | INTRAMUSCULAR | Status: AC
Start: 2021-08-08 — End: 2021-08-08
  Administered 2021-08-08: 4 mg via INTRAVENOUS
  Filled 2021-08-08: qty 2

## 2021-08-08 MED ORDER — ACETAMINOPHEN 500 MG PO TABS: 1000.0000 mg | ORAL_TABLET | Freq: Four times a day (QID) | ORAL | Status: DC | PRN

## 2021-08-08 NOTE — ED Notes (Signed)
C/o N/V and dry mouth. Reports she has been having emesis since Friday but N/V intermittently for a few weeks

## 2021-08-08 NOTE — ED Provider Notes (Signed)
[facility] Provider Note    Event Date/Time   First MD Initiated Contact with Patient 08/08/21 1023     (approximate)   History   Nausea and Emesis   HPI  Colleen Steele is a 21 y.o. female   presents to the ED with complaint of continued vomiting and a unable to keep fluids down for several weeks.  Patient reports that she is 6 months pregnant and has not seen her OB since October due to her work schedule.  Patient reports this is her second pregnancy and that she is being seen by [facility].  She was told on her first visit that she was high risk pregnancy for preeclampsia and was being seen once week.  She denies any fever, chills, nausea or vomiting.  She denies any upper respiratory symptoms, sore throat or cough.  She also denies any urinary symptoms.  Last OB/GYN visit at [facility] was on 05/13/2021.  She voices concern with decreased fetal movement.  She denies any vaginal discharge or bleeding.      Physical Exam   Triage Vital Signs: ED Triage Vitals  Enc Vitals Group     BP 08/08/21 1001 129/85     Pulse Rate 08/08/21 1001 84     Resp 08/08/21 1001 20     Temp 08/08/21 1001 98.6 F (37 C)     Temp Source 08/08/21 1001 Oral     SpO2 08/08/21 1001 99 %     Weight 08/08/21 1001 173 lb (78.5 kg)     Height 08/08/21 1001 5\' 2"  (1.575 m)     Head Circumference --      Peak Flow --      Pain Score 08/08/21 1016 0     Pain Loc --      Pain Edu? --      Excl. in [place]? --     Most recent vital signs: Vitals:   08/08/21 1409 08/08/21 1436  BP: 120/76 129/77  Pulse: 98 71  Resp: 18 14  Temp:  99.8 F (37.7 C)  SpO2: 100%     General: Awake, no distress.  Appears anxious, nontoxic in appearance. CV:  Good peripheral perfusion.  Heart regular rate and rhythm without murmur. Resp:  Normal effort.  Lungs are clear bilaterally. Abd:  No distention.  Abdomen pregnant abdomen.  Bowel sounds are present x4  quadrants. Other:     ED Results / Procedures / Treatments   Labs (all labs ordered are listed, but only abnormal results are displayed) Labs Reviewed  URINALYSIS, ROUTINE W REFLEX MICROSCOPIC - Abnormal; Notable for the following components:      Result Value   APPearance HAZY (*)    Ketones, ur 80 (*)    Protein, ur TRACE (*)    All other components within normal limits  CBC WITH DIFFERENTIAL/PLATELET - Abnormal; Notable for the following components:   WBC 14.2 (*)    RBC 3.80 (*)    Hemoglobin 11.8 (*)    HCT 35.3 (*)    Neutro Abs 10.6 (*)    Abs Immature Granulocytes 0.10 (*)    All other components within normal limits  BASIC METABOLIC PANEL - Abnormal; Notable for the following components:   Glucose, Bld 69 (*)    Calcium 8.4 (*)    All other components within normal limits  CBG MONITORING, ED - Abnormal; Notable for the following components:   Glucose-Capillary 65 (*)    All other  components within normal limits  RESP PANEL BY RT-PCR (FLU A&B, COVID) ARPGX2  URINALYSIS, MICROSCOPIC (REFLEX)  COMPREHENSIVE METABOLIC PANEL  PROTEIN / CREATININE RATIO, URINE     PROCEDURES:  Critical Care performed: No  Procedures   MEDICATIONS ORDERED IN ED: Medications  calcium carbonate (TUMS - dosed in mg elemental calcium) chewable tablet 400 mg of elemental calcium (has no administration in time range)  acetaminophen (TYLENOL) tablet 1,000 mg (has no administration in time range)  ondansetron (ZOFRAN) injection 4 mg (4 mg Intravenous Given 08/08/21 1409)  lactated ringers bolus 1,000 mL (1,000 mLs Intravenous New Bag/Given 08/08/21 1540)     IMPRESSION / MDM / ASSESSMENT AND PLAN / ED COURSE  I reviewed the triage vital signs and the nursing notes.   Differential diagnosis includes, but is not limited to, emesis due to pregnancy, hyperemesis, urinary tract infection, dehydration, preeclampsia, fatigue.  21 year old female at approximately 6 months gestation with  chronic history of hypertension presents today for persistent emesis, fatigue, and also reports that she has felt less movement of the baby in last several days.  Patient was told on her first visit at [facility] that she was at high risk for preeclampsia.  Patient reports that even though she has a history of hypertension she has not been taking her blood pressure medication.  Arrangements were made for patient to be taken to [facility] for observation.  She was given apple juice prior to being taken upstairs which she reported that she vomited just after drinking it.  She was given Zofran 4 mg IV.  Patient was encouraged to call make an appointment with her OB/GYN at [facility] for consistent prenatal care as she is already been told that she is at high risk.  Patient does have ketones in her urine with a trace of protein.  BMP was unremarkable with the exception of glucose which was low at 69 and therefore apple juice was provided to the patient.  Patient reported that she vomited shortly after drinking it.  Due to patient's persistent emesis, asymptomatic hyperglycemia due to vomiting and being told that she is high risk pregnancy with warnings of preeclampsia arrangements were made for her to be observed in OB for period time and she is strongly encouraged to call and make an appointment with her OB/GYN for follow-up.    FINAL CLINICAL IMPRESSION(S) / ED DIAGNOSES   Final diagnoses:  Vomiting during pregnancy  Hypoglycemia, unspecified  Medically noncompliant     Rx / DC Orders   ED Discharge Orders     None        Note:  This document was prepared using Dragon voice recognition software and may include unintentional dictation errors.   Johnn Hai, PA-C 08/08/21 1553    Nena Polio, MD 08/09/21 2147

## 2021-08-08 NOTE — Discharge Summary (Signed)
Colleen Steele is a 21 y.o. female. She is at [redacted]w[redacted]d gestation. Patient's last menstrual period was 01/25/2021. Estimated Date of Delivery: 11/01/21  Prenatal care site: [facility]   Current pregnancy complicated by:  1) inadequate prenatal care, has not been seen since 04/2021 2) chronic HTN, prescribed Procardia, not taking it 3) obesity pregnancy 4) STI in pregnancy (trichomonas and chlamydia)  5) Hyperemesis gravidarum 6) THC exposure first trimester 7) abnormal thyroid  Chief complaint: decreased fetal movement, nausea, vomiting, dehydration  She states she has been vomiting daily throughout her pregnancy and today feels dehydrated. She states she drinks about 4-5 bottles of water daily but vomits throughout the day. She reports today has a low-grade dull headache which she is unsure if it is r/t dehydration or preeclampsia.  She has not had a prenatal visit since 05/13/2022 which she states is because she is unable to leave her work for prenatal visits. She was prescribed procardia for chronic HTN but states when she takes procardia, she feels dizzy and when she checks her BP when not taking procardia, her BP is normal. She stopped taking the procardia 1 month ago.  She states decreased fetal movement this afternoon.  S: Resting comfortably. no CTX, no VB.no LOF,  Denies: visual changes, SOB, or RUQ/epigastric pain  Maternal Medical History:   Past Medical History:  Diagnosis Date   Asthma    Chlamydia    Chronic kidney disease    Diarrhea    Environmental allergies    Gonorrhea    Hypertension    Vomiting     Past Surgical History:  Procedure Laterality Date   MOUTH SURGERY     NO PAST SURGERIES      No Known Allergies  Prior to Admission medications   Medication Sig Start Date End Date Taking? Authorizing Provider  acetaminophen (TYLENOL) 500 MG tablet Take 1,000 mg by mouth every 6 (six) hours as needed.   Yes [provider]  Prenatal  Vit-Fe Fumarate-FA (MULTIVITAMIN-PRENATAL) 27-0.8 MG TABS tablet Take 1 tablet by mouth daily at 12 noon.   Yes [provider]  calcium carbonate (TUMS - DOSED IN MG ELEMENTAL CALCIUM) 500 MG chewable tablet Chew 2 tablets (400 mg of elemental calcium total) by mouth every 4 (four) hours as needed for indigestion. 08/08/21   Gertie Fey, CNM  metoCLOPramide (REGLAN) 5 MG tablet Take 1-2 tablets (5-10 mg total) by mouth every 8 (eight) hours as needed for nausea or vomiting. Patient not taking: Reported on 08/08/2021 03/05/21   Menshew, Dannielle Karvonen, PA-C  ferrous sulfate (FERROUSUL) 325 (65 FE) MG tablet Take 1 tablet (325 mg total) by mouth daily with breakfast. Patient not taking: Reported on 09/18/2018 07/02/18 05/02/19  Laury Deep, CNM      Social History: She  reports that she has never smoked. She has never used smokeless tobacco. She reports that she does not currently use alcohol. She reports that she does not currently use drugs after having used the following drugs: Marijuana.  Family History: family history includes Diverticulitis in her sister; Hypertension in her mother.  no history of gyn cancers  Review of Systems: A full review of systems was performed and negative except as noted in the HPI.     O:  BP 129/77    Pulse 71    Temp 99.8 F (37.7 C) (Oral)    Resp 14    Ht 5\' 2"  (1.575 m)    Wt 78 kg  LMP 01/25/2021    SpO2 100%    BMI 31.45 kg/m  Results for orders placed or performed during the hospital encounter of 08/08/21 (from the past 48 hour(s))  Urinalysis, Routine w reflex microscopic Urine, Clean Catch   Collection Time: 08/08/21 11:51 AM  Result Value Ref Range   Color, Urine YELLOW YELLOW   APPearance HAZY (A) CLEAR   Specific Gravity, Urine 1.025 1.005 - 1.030   pH 7.0 5.0 - 8.0   Glucose, UA NEGATIVE NEGATIVE mg/dL   Hgb urine dipstick NEGATIVE NEGATIVE   Bilirubin Urine NEGATIVE NEGATIVE   Ketones, ur 80 (A) NEGATIVE mg/dL    Protein, ur TRACE (A) NEGATIVE mg/dL   Nitrite NEGATIVE NEGATIVE   Leukocytes,Ua NEGATIVE NEGATIVE  CBC with Differential   Collection Time: 08/08/21 11:51 AM  Result Value Ref Range   WBC 14.2 (H) 4.0 - 10.5 K/uL   RBC 3.80 (L) 3.87 - 5.11 MIL/uL   Hemoglobin 11.8 (L) 12.0 - 15.0 g/dL   HCT 35.3 (L) 36.0 - 46.0 %   MCV 92.9 80.0 - 100.0 fL   MCH 31.1 26.0 - 34.0 pg   MCHC 33.4 30.0 - 36.0 g/dL   RDW 12.4 11.5 - 15.5 %   Platelets 212 150 - 400 K/uL   nRBC 0.0 0.0 - 0.2 %   Neutrophils Relative % 74 %   Neutro Abs 10.6 (H) 1.7 - 7.7 K/uL   Lymphocytes Relative 17 %   Lymphs Abs 2.4 0.7 - 4.0 K/uL   Monocytes Relative 7 %   Monocytes Absolute 1.0 0.1 - 1.0 K/uL   Eosinophils Relative 1 %   Eosinophils Absolute 0.1 0.0 - 0.5 K/uL   Basophils Relative 0 %   Basophils Absolute 0.0 0.0 - 0.1 K/uL   Immature Granulocytes 1 %   Abs Immature Granulocytes 0.10 (H) 0.00 - 0.07 K/uL  Basic metabolic panel   Collection Time: 08/08/21 11:51 AM  Result Value Ref Range   Sodium 135 135 - 145 mmol/L   Potassium 3.7 3.5 - 5.1 mmol/L   Chloride 105 98 - 111 mmol/L   CO2 23 22 - 32 mmol/L   Glucose, Bld 69 (L) 70 - 99 mg/dL   BUN 6 6 - 20 mg/dL   Creatinine, Ser 0.49 0.44 - 1.00 mg/dL   Calcium 8.4 (L) 8.9 - 10.3 mg/dL   GFR, Estimated >60 >60 mL/min   Anion gap 7 5 - 15  Urinalysis, Microscopic (reflex)   Collection Time: 08/08/21 11:51 AM  Result Value Ref Range   RBC / HPF 0-5 0 - 5 RBC/hpf   WBC, UA 0-5 0 - 5 WBC/hpf   Bacteria, UA NONE SEEN NONE SEEN   Squamous Epithelial / LPF 11-20 0 - 5   Mucus PRESENT   Protein / creatinine ratio, urine   Collection Time: 08/08/21 11:51 AM  Result Value Ref Range   Creatinine, Urine 208 mg/dL   Total Protein, Urine 18 mg/dL   Protein Creatinine Ratio 0.09 0.00 - 0.15 mg/mg[Cre]  Resp Panel by RT-PCR (Flu A&B, Covid) Nasopharyngeal Swab   Collection Time: 08/08/21 12:42 PM   Specimen: Nasopharyngeal Swab; Nasopharyngeal(NP) swabs in vial  transport medium  Result Value Ref Range   SARS Coronavirus 2 by RT PCR NEGATIVE NEGATIVE   Influenza A by PCR NEGATIVE NEGATIVE   Influenza B by PCR NEGATIVE NEGATIVE  CBG monitoring, ED   Collection Time: 08/08/21  2:03 PM  Result Value Ref Range   Glucose-Capillary 65 (  L) 70 - 99 mg/dL   Comment 1 Notify RN    Comment 2 Document in Chart   Comprehensive metabolic panel   Collection Time: 08/08/21  4:06 PM  Result Value Ref Range   Sodium 134 (L) 135 - 145 mmol/L   Potassium 3.2 (L) 3.5 - 5.1 mmol/L   Chloride 105 98 - 111 mmol/L   CO2 21 (L) 22 - 32 mmol/L   Glucose, Bld 76 70 - 99 mg/dL   BUN <5 (L) 6 - 20 mg/dL   Creatinine, Ser 0.48 0.44 - 1.00 mg/dL   Calcium 8.1 (L) 8.9 - 10.3 mg/dL   Total Protein 6.4 (L) 6.5 - 8.1 g/dL   Albumin 3.2 (L) 3.5 - 5.0 g/dL   AST 19 15 - 41 U/L   ALT 16 0 - 44 U/L   Alkaline Phosphatase 73 38 - 126 U/L   Total Bilirubin 0.7 0.3 - 1.2 mg/dL   GFR, Estimated >60 >60 mL/min   Anion gap 8 5 - 15      Constitutional: NAD, AAOx3  HE/ENT: extraocular movements grossly intact, moist mucous membranes CV: RRR PULM: nl respiratory effort, CTABL     Abd: gravid, non-tender, non-distended, soft      Ext: Non-tender, Nonedematous   Psych: mood appropriate, speech normal Pelvic: deferred  Fetal  monitoring: Cat 1 Appropriate for GA Baseline: 140 Variability: moderate Accelerations: present x >2 10x10 Decelerations absent Time 60mins  A/P: 21 y.o. [redacted]w[redacted]d here for antenatal surveillance for decreased fetal movement, nausea, vomiting, and headache  Principle Diagnosis:  High risk pregnancy in second trimester  Labor: not present.  Fetal Wellbeing: Reassuring Cat 1 tracing. Fetal movement palpated and felt by mother during NST. Reactive NST  Preeclampsia: not present Stressed the importance of coming to prenatal visits. Discussed talking to her job and reviewing what paperwork she can fill out to be able to attend prenatal visits. Reviewed  that she is high-risk for this pregnancy so needs frequent prenatal visits. She still needs her anatomy scan and glucose test for this pregnancy. She verbalized understanding and states she will call the office 08/09/21 to schedule an appointment. BP normal today, do not resume procardia at this time. Follow up in office for BP evaluation. Increase oral hydration, take Zofran daily for nausea, eat small frequent snacks and meals. D/c home stable, precautions reviewed, follow-up as scheduled.    Gertie Fey, CNM 08/08/2021 5:04 PM

## 2021-08-08 NOTE — OB Triage Note (Signed)
Patient Discharged home per provider. Pt informed when to return to the ED for further evaluation. Pt instructed to keep all follow up appointments with her provider. AVS given to patient and RN answered all questions and patient has no further questions at this time. Pt discharged home in stable condition.

## 2021-08-08 NOTE — OB Triage Note (Signed)
Patient to Obs 4 with complaint of decreased fetal movement.  Reports feeling dehydrated from being nauseated.  Nausea is better per patient after ED visit with zofran given.

## 2021-08-08 NOTE — ED Notes (Signed)
Patient given apple juice and water.

## 2021-08-08 NOTE — ED Triage Notes (Signed)
Pt reports is 6 months pregnant and has been vomiting. Pt reports has not been to the MD since October because they could not work around her work schedule and she needs to work. Pt reports 2nd pregnancy and is seen at [facility] so she thought she would come here and be checked out. Pt denies pain, reports only vomiting.

## 2021-08-23 ENCOUNTER — Ambulatory Visit: Payer: Medicaid Other | Attending: Obstetrics and Gynecology

## 2021-08-28 LAB — OB RESULTS CONSOLE GC/CHLAMYDIA
Chlamydia: POSITIVE
Gonorrhea: NEGATIVE

## 2021-08-28 LAB — OB RESULTS CONSOLE HIV ANTIBODY (ROUTINE TESTING): HIV: NONREACTIVE

## 2021-08-28 LAB — OB RESULTS CONSOLE HEPATITIS B SURFACE ANTIGEN: Hepatitis B Surface Ag: NEGATIVE

## 2021-08-28 LAB — OB RESULTS CONSOLE RPR: RPR: NONREACTIVE

## 2021-09-27 ENCOUNTER — Other Ambulatory Visit: Payer: Self-pay

## 2021-09-27 ENCOUNTER — Encounter: Payer: Self-pay | Admitting: Emergency Medicine

## 2021-09-27 ENCOUNTER — Emergency Department
Admission: EM | Admit: 2021-09-27 | Discharge: 2021-09-27 | Discharge status: Against Medical Advice | Payer: Medicaid Other | Attending: Emergency Medicine | Admitting: Emergency Medicine

## 2021-09-27 DIAGNOSIS — O368139 Decreased fetal movements, third trimester, other fetus: Secondary | ICD-10-CM | POA: Insufficient documentation

## 2021-09-27 DIAGNOSIS — O4403 Placenta previa specified as without hemorrhage, third trimester: Secondary | ICD-10-CM | POA: Diagnosis not present

## 2021-09-27 DIAGNOSIS — Z3A35 35 weeks gestation of pregnancy: Secondary | ICD-10-CM | POA: Diagnosis not present

## 2021-09-27 DIAGNOSIS — R0602 Shortness of breath: Secondary | ICD-10-CM | POA: Insufficient documentation

## 2021-09-27 DIAGNOSIS — Z5321 Procedure and treatment not carried out due to patient leaving prior to being seen by health care provider: Secondary | ICD-10-CM | POA: Diagnosis not present

## 2021-09-27 DIAGNOSIS — O99513 Diseases of the respiratory system complicating pregnancy, third trimester: Secondary | ICD-10-CM | POA: Diagnosis not present

## 2021-09-27 NOTE — ED Triage Notes (Addendum)
Pt to ED via POV with c/o [facility] for the last few weeks. She feels that she is not feeling her baby as much as she usually does. She is able to speak in a few words in a sentences but is not using accessory muscles to breath. She did go get an inhaler but they told her it was expired. She went to fill her RX for an inhaler but they told her that it was expired and to come to the ED ?

## 2021-09-27 NOTE — ED Notes (Addendum)
Pt to ED front desk asking about wait time because she has seen 2 people that came in after her go back to be seen before her. Pt informed that I could not give her a wait time and that people may go back before her because we see pts on acuity and not wait time. Pt upset stating that she has been here waiting pt states that she was 8 months pregnant and not feeling her baby move as much as normal. Pt informed that she did not mention this when she checked in because I specifically asked if she was having any pregnancy related complaints and she said no. Pt states that she did inform triage nurse of this. Georgina Peer, RN was standing by and stated that pt did tell her this and NP in triage felt that pt should be cleared for her breathing first and then could go upstairs to be evaluated. Pt does not want to continue to wait and be seen. Pt states that she wants to be discharged. Pt informed that we would not discharge her but she was free to leave if that's what she wanted to do. Pt had her visitor push her outside.  ?

## 2021-09-27 NOTE — ED Provider Triage Note (Signed)
Emergency Medicine Provider Triage Evaluation Note ? ?Colleen Steele , a 21 y.o. female  was evaluated in triage.  Pt complains of shortness that has been gradually worsening over the past 2 weeks. [redacted] weeks pregnant. No chest pain, cough, fever. Decreased fetal movement as well, but is aware she has placenta previa and was told by OB that was normal. ? ?Review of Systems  ?Positive: Shortness of breath ?Negative: Fever ? ?Physical Exam  ?BP 127/78 (BP Location: Right Arm)   Pulse 89   Temp 98.3 ?F (36.8 ?C)   Resp 20   LMP 01/25/2021   SpO2 100%  ?Gen:   Awake, no distress   ?Resp:  Normal effort  ?MSK:   Moves extremities without difficulty  ?Other:   ? ?Medical Decision Making  ?Medically screening exam initiated at 12:05 PM.  Appropriate orders placed.  Sway Roggenkamp was informed that the remainder of the evaluation will be completed by another provider, this initial triage assessment does not replace that evaluation, and the importance of remaining in the ED until their evaluation is complete. ?  ?Chinita Pester, FNP ?09/27/21 1207 ? ?

## 2021-09-29 ENCOUNTER — Observation Stay: Payer: Medicaid Other

## 2021-09-29 ENCOUNTER — Observation Stay
Admission: EM | Admit: 2021-09-29 | Discharge: 2021-09-30 | Disposition: A | Discharge status: Home / Self Care | Payer: Medicaid Other | Attending: Certified Nurse Midwife | Admitting: Certified Nurse Midwife

## 2021-09-29 ENCOUNTER — Encounter: Payer: Self-pay | Admitting: Obstetrics and Gynecology

## 2021-09-29 ENCOUNTER — Other Ambulatory Visit: Payer: Self-pay

## 2021-09-29 DIAGNOSIS — N189 Chronic kidney disease, unspecified: Secondary | ICD-10-CM | POA: Diagnosis not present

## 2021-09-29 DIAGNOSIS — J45909 Unspecified asthma, uncomplicated: Secondary | ICD-10-CM | POA: Diagnosis not present

## 2021-09-29 DIAGNOSIS — I129 Hypertensive chronic kidney disease with stage 1 through stage 4 chronic kidney disease, or unspecified chronic kidney disease: Secondary | ICD-10-CM | POA: Insufficient documentation

## 2021-09-29 DIAGNOSIS — O288 Other abnormal findings on antenatal screening of mother: Secondary | ICD-10-CM

## 2021-09-29 DIAGNOSIS — R0602 Shortness of breath: Secondary | ICD-10-CM | POA: Diagnosis not present

## 2021-09-29 DIAGNOSIS — O26893 Other specified pregnancy related conditions, third trimester: Secondary | ICD-10-CM | POA: Diagnosis not present

## 2021-09-29 DIAGNOSIS — O10213 Pre-existing hypertensive chronic kidney disease complicating pregnancy, third trimester: Secondary | ICD-10-CM | POA: Diagnosis not present

## 2021-09-29 DIAGNOSIS — Z3A35 35 weeks gestation of pregnancy: Secondary | ICD-10-CM | POA: Diagnosis not present

## 2021-09-29 DIAGNOSIS — O99513 Diseases of the respiratory system complicating pregnancy, third trimester: Secondary | ICD-10-CM | POA: Diagnosis not present

## 2021-09-29 LAB — COMPREHENSIVE METABOLIC PANEL
ALT: 18 U/L (ref 0–44)
AST: 24 U/L (ref 15–41)
Albumin: 3 g/dL — ABNORMAL LOW (ref 3.5–5.0)
Alkaline Phosphatase: 131 U/L — ABNORMAL HIGH (ref 38–126)
Anion gap: 9 (ref 5–15)
BUN: <5 mg/dL — ABNORMAL LOW (ref 6–20)
CO2: 21 mmol/L — ABNORMAL LOW (ref 22–32)
Calcium: 8.4 mg/dL — ABNORMAL LOW (ref 8.9–10.3)
Chloride: 105 mmol/L (ref 98–111)
Creatinine, Ser: 0.54 mg/dL (ref 0.44–1.00)
GFR, Estimated: >60 mL/min (ref >60)
Glucose, Bld: 88 mg/dL (ref 70–99)
Potassium: 3.4 mmol/L — ABNORMAL LOW (ref 3.5–5.1)
Sodium: 135 mmol/L (ref 135–145)
Total Bilirubin: 0.7 mg/dL (ref 0.3–1.2)
Total Protein: 6.4 g/dL — ABNORMAL LOW (ref 6.5–8.1)

## 2021-09-29 LAB — CBC
HCT: 33.6 % — ABNORMAL LOW (ref 36.0–46.0)
Hemoglobin: 11.4 g/dL — ABNORMAL LOW (ref 12.0–15.0)
MCH: 30.2 pg (ref 26.0–34.0)
MCHC: 33.9 g/dL (ref 30.0–36.0)
MCV: 88.9 fL (ref 80.0–100.0)
Platelets: 215 10*3/uL (ref 150–400)
RBC: 3.78 MIL/uL — ABNORMAL LOW (ref 3.87–5.11)
RDW: 12.6 % (ref 11.5–15.5)
WBC: 11.9 10*3/uL — ABNORMAL HIGH (ref 4.0–10.5)
nRBC: 0 % (ref 0.0–0.2)

## 2021-09-29 MED ORDER — IOHEXOL 350 MG/ML SOLN
75.0000 mL | Freq: Once | INTRAVENOUS | Status: AC | PRN
  Administered 2021-09-29: 75 mL via INTRAVENOUS

## 2021-09-29 MED ORDER — PRENATAL MULTIVITAMIN CH: 1.0000 | ORAL_TABLET | Freq: Every day | ORAL | Status: DC

## 2021-09-29 MED ORDER — ACETAMINOPHEN 325 MG PO TABS: 650.0000 mg | ORAL_TABLET | ORAL | Status: DC | PRN

## 2021-09-29 MED ORDER — IPRATROPIUM-ALBUTEROL 0.5-2.5 (3) MG/3ML IN SOLN
3.0000 mL | Freq: Four times a day (QID) | RESPIRATORY_TRACT | Status: DC
  Administered 2021-09-29: 3 mL via RESPIRATORY_TRACT
  Filled 2021-09-29: qty 3

## 2021-09-29 MED ORDER — CALCIUM CARBONATE ANTACID 500 MG PO CHEW: 2.0000 | CHEWABLE_TABLET | ORAL | Status: DC | PRN

## 2021-09-29 NOTE — OB Triage Note (Signed)
Colleen Steele is a 21 y.o. female. She is at [redacted]w[redacted]d gestation. Patient's last menstrual period was 01/25/2021. ?Estimated Date of Delivery: 11/01/21 ? ?Prenatal care site: [facility]  ? ?Current pregnancy complicated by:  ?- obesity in pregnancy ?- STI infection (trich and chlamydia) ?- hyperemesis gravidarum ?- cHTN, no medications ?- THC exposure in 1st trimester ?- abnormal TSH, pt encouraged to f/u with endocrinology. ? ?Chief complaint: shortness of breath ? ?Colleen Steele reports shortness of breath for several weeks, feeling like she has to catch her breath every several words or every sentence, even if she is resting. She reports history of severe asthma until high school where she was on three different inhalers. She does not know the name of her inhalers, states they were blue, red, and yellow. She has not had an asthma attack since she was in high school. She denies cough and chest pain, she denies wheezing. ? ?S: Resting comfortably. no CTX, no VB.no LOF,  Active fetal movement. Denies: HA, visual changes, SOB, or RUQ/epigastric pain ? ?Maternal Medical History:  ? ?Past Medical History:  ?Diagnosis Date  ? Asthma   ? Chlamydia   ? Chronic kidney disease   ? Diarrhea   ? Environmental allergies   ? Gonorrhea   ? Hypertension   ? Vomiting   ? ? ?Past Surgical History:  ?Procedure Laterality Date  ? MOUTH SURGERY    ? NO PAST SURGERIES    ? ? ?No Known Allergies ? ?Prior to Admission medications   ?Medication Sig Start Date End Date Taking? Authorizing Provider  ?acetaminophen (TYLENOL) 500 MG tablet Take 1,000 mg by mouth every 6 (six) hours as needed.    [provider]  ?calcium carbonate (TUMS - DOSED IN MG ELEMENTAL CALCIUM) 500 MG chewable tablet Chew 2 tablets (400 mg of elemental calcium total) by mouth every 4 (four) hours as needed for indigestion. 08/08/21   Janyce Llanos, CNM  ?metoCLOPramide (REGLAN) 5 MG tablet Take 1-2 tablets (5-10 mg total) by mouth every 8  (eight) hours as needed for nausea or vomiting. ?Patient not taking: Reported on 08/08/2021 03/05/21   Menshew, Charlesetta Ivory, PA-C  ?Prenatal Vit-Fe Fumarate-FA (MULTIVITAMIN-PRENATAL) 27-0.8 MG TABS tablet Take 1 tablet by mouth daily at 12 noon.    [provider]  ?ferrous sulfate (FERROUSUL) 325 (65 FE) MG tablet Take 1 tablet (325 mg total) by mouth daily with breakfast. ?Patient not taking: Reported on 09/18/2018 07/02/18 05/02/19  [name], CNM  ? ? ? ? ?Social History: She  reports that she has never smoked. She has never used smokeless tobacco. She reports that she does not currently use alcohol. She reports that she does not currently use drugs after having used the following drugs: Marijuana. ? ?Family History: family history includes Diverticulitis in her sister; Hypertension in her mother.  no history of gyn cancers ? ?Review of Systems: A full review of systems was performed and negative except as noted in the HPI.   ? ? ?O: ? BP 122/67 (BP Location: Left Arm)   Pulse 80   Temp 98 ?F (36.7 ?C) (Oral)   Resp 16   Ht 5\' 2"  (1.575 m)   Wt 76.2 kg   LMP 01/25/2021   SpO2 100%   BMI 30.73 kg/m?  ?No results found for this or any previous visit (from the past 48 hour(s)).  ? ?Constitutional: NAD, AAOx3  ?HE/ENT: extraocular movements grossly intact, moist mucous membranes ?CV: RRR ?PULM: expiratory  chest tightness in lower lobes, expiratory wheeze heard in left lower lobe, she has to stop every 3-4 words to take a breath    ?Abd: gravid, non-tender, non-distended, soft      ?Ext: Non-tender, Nonedematous   ?Psych: mood appropriate, speech normal ?Pelvic: deferred ? Pelvic exam: normal external genitalia, vulva, vagina, cervix, uterus and adnexa. ? ?Fetal  monitoring: Cat 1 Appropriate for GA ?Baseline: 125bpm ?Variability: moderate ?Accelerations: present x >2 ?Decelerations absent ? ?A/P: 21 y.o. [redacted]w[redacted]d here for antenatal surveillance for shortness of breath ? ?Principle Diagnosis:  High  risk pregnancy in third trimester ? ?Labor: not present.  ?Fetal Wellbeing: Reassuring Cat 1 tracing. ?Her RA SpO2 ranges from 98-100% even when she is talking  ?EKG NSR ?Chest x-ray negative ?BPP 8/8 ?Reviewed pt's symptoms and results with Dr. Jean Rosenthal. Will give duoneb and reevaluate lung sounds and pt's work of breathing. ?If work of breathing not improved after duoneb, consult MFM and consider CT-PA with contrast.  ? ? ?Janyce Llanos, CNM ?09/29/2021 ?9:08 PM ? ?

## 2021-09-29 NOTE — Progress Notes (Signed)
I have reviewed the patient's hospital course to this point. She is reporting increased work of breathing, which is new over the past two weeks and worsening, especially in the past week.  She was seen in clinic today and concern was high enough for fetal status (non-reacting NST) and her physical appearance was concerning. ? ?She notes a history of asthma, but no exacerbations in years.  She does not report chest pain. She only needs to take a deep breath between every couple of sentences.  She does not right leg discomfort and trouble, but not swelling necessarily. ? ?She has had a negative CXR, normal EKG. ? ?She has had a Duonebs treatments. ? ?BP 122/67 (BP Location: Left Arm)   Pulse 80   Temp 98 ?F (36.7 ?C) (Oral)   Resp 16   Ht 5\' 2"  (1.575 m)   Wt 76.2 kg   LMP 01/25/2021   SpO2 100%   BMI 30.73 kg/m?   ?Gen: nad ?Pulm: CTAB, notably increase in work of breathing intermittently. ? ?A/P: ?- CTPA for pulmonary embolism. Discussed risks and benefits with patient and she is agreeable to have CT angiogram chest to assess for PE. ?- consider ECHO, if  negative ?- See note from CNM for more details. ? ?03/28/2021, MD, FACOG ?[facility] ?09/29/2021 9:33 PM   ?

## 2021-09-29 NOTE — Discharge Summary (Signed)
Colleen Steele is a 21 y.o. female. She is at [redacted]w[redacted]d gestation. Patient's last menstrual period was 01/25/2021. ?Estimated Date of Delivery: 11/01/21 ? ?Prenatal care site: [facility]  ? ?S: Resting comfortably. States her SOB is tolerable right now, she is not in distress at the moment. no CTX, no VB.no LOF,  Active fetal movement. Denies: HA, visual changes, SOB, or RUQ/epigastric pain ? ?Maternal Medical History:  ? ?Past Medical History:  ?Diagnosis Date  ? Asthma   ? Chlamydia   ? Chronic kidney disease   ? Diarrhea   ? Environmental allergies   ? Gonorrhea   ? Hypertension   ? Vomiting   ? ? ?Past Surgical History:  ?Procedure Laterality Date  ? MOUTH SURGERY    ? NO PAST SURGERIES    ? ? ?No Known Allergies ? ?Prior to Admission medications   ?Medication Sig Start Date End Date Taking? Authorizing Provider  ?acetaminophen (TYLENOL) 500 MG tablet Take 1,000 mg by mouth every 6 (six) hours as needed.    [provider]  ?calcium carbonate (TUMS - DOSED IN MG ELEMENTAL CALCIUM) 500 MG chewable tablet Chew 2 tablets (400 mg of elemental calcium total) by mouth every 4 (four) hours as needed for indigestion. 08/08/21   Janyce Llanos, CNM  ?metoCLOPramide (REGLAN) 5 MG tablet Take 1-2 tablets (5-10 mg total) by mouth every 8 (eight) hours as needed for nausea or vomiting. ?Patient not taking: Reported on 08/08/2021 03/05/21   Menshew, Charlesetta Ivory, PA-C  ?Prenatal Vit-Fe Fumarate-FA (MULTIVITAMIN-PRENATAL) 27-0.8 MG TABS tablet Take 1 tablet by mouth daily at 12 noon.    [provider]  ?ferrous sulfate (FERROUSUL) 325 (65 FE) MG tablet Take 1 tablet (325 mg total) by mouth daily with breakfast. ?Patient not taking: Reported on 09/18/2018 07/02/18 05/02/19  [name], CNM  ? ? ? ? ?Social History: She  reports that she has never smoked. She has never used smokeless tobacco. She reports that she does not currently use alcohol. She reports that she does not currently use  drugs after having used the following drugs: Marijuana. ? ?Family History: family history includes Diverticulitis in her sister; Hypertension in her mother.  no history of gyn cancers ? ?Review of Systems: A full review of systems was performed and negative except as noted in the HPI.   ? ? ?O: ? BP 122/67 (BP Location: Left Arm)   Pulse 80   Temp 98 ?F (36.7 ?C) (Oral)   Resp 16   Ht 5\' 2"  (1.575 m)   Wt 76.2 kg   LMP 01/25/2021   SpO2 100%   BMI 30.73 kg/m?  ?Results for orders placed or performed during the hospital encounter of 09/29/21 (from the past 48 hour(s))  ?CBC  ? Collection Time: 09/29/21  9:32 PM  ?Result Value Ref Range  ? WBC 11.9 (H) 4.0 - 10.5 K/uL  ? RBC 3.78 (L) 3.87 - 5.11 MIL/uL  ? Hemoglobin 11.4 (L) 12.0 - 15.0 g/dL  ? HCT 33.6 (L) 36.0 - 46.0 %  ? MCV 88.9 80.0 - 100.0 fL  ? MCH 30.2 26.0 - 34.0 pg  ? MCHC 33.9 30.0 - 36.0 g/dL  ? RDW 12.6 11.5 - 15.5 %  ? Platelets 215 150 - 400 K/uL  ? nRBC 0.0 0.0 - 0.2 %  ?Comprehensive metabolic panel  ? Collection Time: 09/29/21  9:32 PM  ?Result Value Ref Range  ? Sodium 135 135 - 145 mmol/L  ? Potassium  3.4 (L) 3.5 - 5.1 mmol/L  ? Chloride 105 98 - 111 mmol/L  ? CO2 21 (L) 22 - 32 mmol/L  ? Glucose, Bld 88 70 - 99 mg/dL  ? BUN <5 (L) 6 - 20 mg/dL  ? Creatinine, Ser 0.54 0.44 - 1.00 mg/dL  ? Calcium 8.4 (L) 8.9 - 10.3 mg/dL  ? Total Protein 6.4 (L) 6.5 - 8.1 g/dL  ? Albumin 3.0 (L) 3.5 - 5.0 g/dL  ? AST 24 15 - 41 U/L  ? ALT 18 0 - 44 U/L  ? Alkaline Phosphatase 131 (H) 38 - 126 U/L  ? Total Bilirubin 0.7 0.3 - 1.2 mg/dL  ? GFR, Estimated >60 >60 mL/min  ? Anion gap 9 5 - 15  ?  ? ? ?Constitutional: NAD, AAOx3  ?HE/ENT: extraocular movements grossly intact, moist mucous membranes ?CV: RRR ?PULM: nl respiratory effort, CTABL     ?Abd: gravid, non-tender, non-distended, soft      ?Ext: Non-tender, Nonedematous   ?Psych: mood appropriate, speech normal ?Pelvic: deferred ? ?Fetal  monitoring: Cat 1 Appropriate for GA ?Baseline:  125bpm ?Variability: moderate ?Accelerations: present x >2 ?Decelerations absent ? ?A/P: 21 y.o. [redacted]w[redacted]d here for antenatal surveillance for shortness of breath in pregnancy ? ?Principle Diagnosis:  High risk pregnancy in third trimester ? ?I discussed the POC with Dr. Jean Rosenthal. Her CT scan is WNL, no signs of a PE. We recommend an echocardiogram in conjunction with MFM recommendations and her symptoms. I discussed in depth with her staying overnight in the hospital and having an echocardiogram in the morning or going home tonight and following-up outpatient. She requests to be discharged and go home with outpatient follow-up with cardiology for her echocardiogram. We reviewed warning signs and precautions and worsening symptoms in detail and she verbalized understanding and states she will return with any concerns.  ?Referral placed to [facility] for urgent echocardiogram ?Labor: not present.  ?Fetal Wellbeing: Reassuring Cat 1 tracing. ?Reactive NST  ?D/c home stable, precautions reviewed, follow-up as scheduled.  ? ? ?Janyce Llanos, CNM ?09/30/2021 ?11:59 PM ? ?

## 2021-09-29 NOTE — OB Triage Note (Signed)
Pt sent from office for NST and SOB. Pt reports SOB for a couple weeks. +FM. Denies LOF, bleeding, CTX. VSS. Monitors applied.  ?

## 2021-09-29 NOTE — Progress Notes (Signed)
Pt off unit for xray and US.

## 2021-09-29 NOTE — Progress Notes (Signed)
Pt returned to unit.

## 2021-09-30 DIAGNOSIS — O26893 Other specified pregnancy related conditions, third trimester: Secondary | ICD-10-CM | POA: Diagnosis not present

## 2021-09-30 LAB — OB RESULTS CONSOLE GBS: GBS: NEGATIVE

## 2021-09-30 LAB — OB RESULTS CONSOLE RUBELLA ANTIBODY, IGM: Rubella: IMMUNE

## 2021-09-30 LAB — OB RESULTS CONSOLE VARICELLA ZOSTER ANTIBODY, IGG: Varicella: IMMUNE

## 2021-09-30 NOTE — Discharge Instructions (Signed)

## 2021-09-30 NOTE — OB Triage Note (Signed)
Pt discharged home by provider in stable condition. RN provided discharge instructions to patient. RN reviewed follow up care with patient. Patient verbalized understanding and all questions answered at this time.  ?

## 2021-10-13 ENCOUNTER — Observation Stay
Admission: EM | Admit: 2021-10-13 | Discharge: 2021-10-13 | Disposition: A | Discharge status: Home / Self Care | Payer: Medicaid Other | Attending: Obstetrics and Gynecology | Admitting: Obstetrics and Gynecology

## 2021-10-13 ENCOUNTER — Encounter: Payer: Self-pay | Admitting: Obstetrics and Gynecology

## 2021-10-13 DIAGNOSIS — E669 Obesity, unspecified: Secondary | ICD-10-CM | POA: Insufficient documentation

## 2021-10-13 DIAGNOSIS — O99213 Obesity complicating pregnancy, third trimester: Secondary | ICD-10-CM | POA: Diagnosis not present

## 2021-10-13 DIAGNOSIS — O368330 Maternal care for abnormalities of the fetal heart rate or rhythm, third trimester, not applicable or unspecified: Principal | ICD-10-CM | POA: Insufficient documentation

## 2021-10-13 DIAGNOSIS — O163 Unspecified maternal hypertension, third trimester: Secondary | ICD-10-CM | POA: Insufficient documentation

## 2021-10-13 DIAGNOSIS — Z3A37 37 weeks gestation of pregnancy: Secondary | ICD-10-CM | POA: Insufficient documentation

## 2021-10-13 DIAGNOSIS — O288 Other abnormal findings on antenatal screening of mother: Principal | ICD-10-CM | POA: Diagnosis present

## 2021-10-13 NOTE — Discharge Summary (Signed)
RN reviewed discharge instructions with patient. Gave patient opportunity for questions. All questions answered at this time. Pt verbalized understanding. Pt discharged home. 

## 2021-10-13 NOTE — OB Triage Note (Signed)
Pt presents from the office for non-reactive NST. Pt denies bleeding or LOf. Pt reports positive fetal movement. VSS. Will continue to monitor.  ?

## 2021-10-13 NOTE — Discharge Summary (Signed)
Patient ID: ?Colleen Steele ?MRN: [id number] ?DOB/AGE: 2000/10/16 21 y.o. ? ?Admit date: 10/13/2021 ?Discharge date: 10/13/2021 ? ?Admission Diagnoses: 21yo G3P1 at [redacted]w[redacted]d sent from the office for a non-reactive NST. ? ?Discharge Diagnoses: RNST ? ?Factors complicating pregnancy: ?1. Obesity in pregnancy - Pre-pregnant BMI 32.9 ?2. STI infection ?3. Hyperemesis gravidarum  ?4. CHTN- no meds prior to preg ?5. THC exposure in 1st trimester  ?6. Abnormal TSH ? ?Prenatal Procedures: NST  ? ?Consults: None ? ?Significant Diagnostic Studies:  ?No results found for this or any previous visit (from the past 168 hour(s)). ? ?Treatments: none ? ?Hospital Course:  ?This is a 21 y.o. G3P1011 with IUP at [redacted]w[redacted]d was sent from the office for a non-reactive NST.  NST was reactive, as noted below.  She was deemed stable for discharge to home with outpatient follow up. ? ?Discharge Physical Exam:  ?BP 136/86   Pulse 86   Temp 98.3 ?F (36.8 ?C) (Oral)   LMP 01/25/2021  ? ?General: NAD ?CV: RRR ?Pulm: nl effort ?ABD: s/nd/nt, gravid ?DVT Evaluation: LE non-ttp, no evidence of DVT on exam. ? ?NST: ?FHR baseline: 125 bpm ?Variability: moderate ?Accelerations: yes ?Decelerations: none ?Time: 20 minutes ?Category/reactivity: reactive ? ?TOCO: quiet ?SVE: deferred  ?  ? ? ?Discharge Condition: Stable ? ?Disposition:  ?Discharge disposition: 01-Home or Self Care ? ? ? ? ? ? ? ?Allergies as of 10/13/2021   ?No Known Allergies ?  ? ?  ?Medication List  ?  ? ?STOP taking these medications   ? ?metoCLOPramide 5 MG tablet ?Commonly known as: Reglan ?  ? ?  ? ?TAKE these medications   ? ?acetaminophen 500 MG tablet ?Commonly known as: TYLENOL ?Take 1,000 mg by mouth every 6 (six) hours as needed. ?  ?calcium carbonate 500 MG chewable tablet ?Commonly known as: TUMS - dosed in mg elemental calcium ?Chew 2 tablets (400 mg of elemental calcium total) by mouth every 4 (four) hours as needed for indigestion. ?  ?multivitamin-prenatal 27-0.8 MG Tabs  tablet ?Take 1 tablet by mouth daily at 12 noon. ?  ? ?  ? ? ? ?Signed: ? ?Haroldine Laws, CNM ?10/13/2021 6:09 PM ? ?  ?

## 2021-10-20 ENCOUNTER — Encounter: Payer: Self-pay | Admitting: Anesthesiology

## 2021-10-20 ENCOUNTER — Encounter: Payer: Self-pay | Admitting: Obstetrics and Gynecology

## 2021-10-20 ENCOUNTER — Other Ambulatory Visit: Payer: Self-pay

## 2021-10-20 ENCOUNTER — Inpatient Hospital Stay
Admission: EM | Admit: 2021-10-20 | Discharge: 2021-10-22 | DRG: 806 | Disposition: A | Discharge status: Home / Self Care | Payer: Medicaid Other | Attending: Obstetrics | Admitting: Obstetrics

## 2021-10-20 DIAGNOSIS — O10919 Unspecified pre-existing hypertension complicating pregnancy, unspecified trimester: Secondary | ICD-10-CM | POA: Insufficient documentation

## 2021-10-20 DIAGNOSIS — D62 Acute posthemorrhagic anemia: Secondary | ICD-10-CM | POA: Diagnosis not present

## 2021-10-20 DIAGNOSIS — F1290 Cannabis use, unspecified, uncomplicated: Secondary | ICD-10-CM | POA: Diagnosis present

## 2021-10-20 DIAGNOSIS — Z3A38 38 weeks gestation of pregnancy: Secondary | ICD-10-CM

## 2021-10-20 DIAGNOSIS — O9081 Anemia of the puerperium: Secondary | ICD-10-CM | POA: Diagnosis not present

## 2021-10-20 DIAGNOSIS — Z3490 Encounter for supervision of normal pregnancy, unspecified, unspecified trimester: Principal | ICD-10-CM | POA: Diagnosis present

## 2021-10-20 DIAGNOSIS — O1002 Pre-existing essential hypertension complicating childbirth: Principal | ICD-10-CM | POA: Diagnosis present

## 2021-10-20 DIAGNOSIS — O99324 Drug use complicating childbirth: Secondary | ICD-10-CM | POA: Diagnosis present

## 2021-10-20 DIAGNOSIS — O99214 Obesity complicating childbirth: Secondary | ICD-10-CM | POA: Diagnosis present

## 2021-10-20 LAB — URINE DRUG SCREEN, QUALITATIVE (ARMC ONLY)
Amphetamines, Ur Screen: NOT DETECTED
Barbiturates, Ur Screen: NOT DETECTED
Benzodiazepine, Ur Scrn: NOT DETECTED
Cannabinoid 50 Ng, Ur ~~LOC~~: POSITIVE — AB
Cocaine Metabolite,Ur ~~LOC~~: NOT DETECTED
MDMA (Ecstasy)Ur Screen: NOT DETECTED
Methadone Scn, Ur: NOT DETECTED
Opiate, Ur Screen: NOT DETECTED
Phencyclidine (PCP) Ur S: NOT DETECTED
Tricyclic, Ur Screen: NOT DETECTED

## 2021-10-20 LAB — COMPREHENSIVE METABOLIC PANEL
ALT: 17 U/L (ref 0–44)
AST: 24 U/L (ref 15–41)
Albumin: 3.3 g/dL — ABNORMAL LOW (ref 3.5–5.0)
Alkaline Phosphatase: 201 U/L — ABNORMAL HIGH (ref 38–126)
Anion gap: 9 (ref 5–15)
BUN: 7 mg/dL (ref 6–20)
CO2: 20 mmol/L — ABNORMAL LOW (ref 22–32)
Calcium: 8.3 mg/dL — ABNORMAL LOW (ref 8.9–10.3)
Chloride: 105 mmol/L (ref 98–111)
Creatinine, Ser: 0.56 mg/dL (ref 0.44–1.00)
GFR, Estimated: >60 mL/min (ref >60)
Glucose, Bld: 97 mg/dL (ref 70–99)
Potassium: 3.2 mmol/L — ABNORMAL LOW (ref 3.5–5.1)
Sodium: 134 mmol/L — ABNORMAL LOW (ref 135–145)
Total Bilirubin: 1.1 mg/dL (ref 0.3–1.2)
Total Protein: 7.2 g/dL (ref 6.5–8.1)

## 2021-10-20 LAB — TYPE AND SCREEN
ABO/RH(D): O POS
Antibody Screen: NEGATIVE

## 2021-10-20 LAB — CBC
HCT: 33.6 % — ABNORMAL LOW (ref 36.0–46.0)
Hemoglobin: 11.3 g/dL — ABNORMAL LOW (ref 12.0–15.0)
MCH: 29.9 pg (ref 26.0–34.0)
MCHC: 33.6 g/dL (ref 30.0–36.0)
MCV: 88.9 fL (ref 80.0–100.0)
Platelets: 239 10*3/uL (ref 150–400)
RBC: 3.78 MIL/uL — ABNORMAL LOW (ref 3.87–5.11)
RDW: 13 % (ref 11.5–15.5)
WBC: 12.6 10*3/uL — ABNORMAL HIGH (ref 4.0–10.5)
nRBC: 0 % (ref 0.0–0.2)

## 2021-10-20 LAB — WET PREP, GENITAL
Sperm: NONE SEEN
Trich, Wet Prep: NONE SEEN
WBC, Wet Prep HPF POC: >=10 — AB (ref <10)
Yeast Wet Prep HPF POC: NONE SEEN

## 2021-10-20 LAB — PROTEIN / CREATININE RATIO, URINE
Creatinine, Urine: 57 mg/dL
Total Protein, Urine: <6 mg/dL

## 2021-10-20 MED ORDER — EPHEDRINE 5 MG/ML INJ: 10.0000 mg | INTRAVENOUS | Status: DC | PRN

## 2021-10-20 MED ORDER — LACTATED RINGERS IV SOLN: 500.0000 mL | Freq: Once | INTRAVENOUS | Status: DC

## 2021-10-20 MED ORDER — LACTATED RINGERS IV SOLN
INTRAVENOUS | Status: DC
Start: 2021-10-20 — End: 2021-10-21

## 2021-10-20 MED ORDER — OXYTOCIN-SODIUM CHLORIDE 30-0.9 UT/500ML-% IV SOLN: 2.5000 [IU]/h | INTRAVENOUS | Status: DC

## 2021-10-20 MED ORDER — LIDOCAINE HCL (PF) 1 % IJ SOLN
INTRAMUSCULAR | Status: AC
  Filled 2021-10-20: qty 30

## 2021-10-20 MED ORDER — OXYTOCIN 10 UNIT/ML IJ SOLN
INTRAMUSCULAR | Status: AC
  Filled 2021-10-20: qty 2

## 2021-10-20 MED ORDER — OXYTOCIN BOLUS FROM INFUSION
333.0000 mL | Freq: Once | INTRAVENOUS | Status: AC
  Administered 2021-10-21: 333 mL via INTRAVENOUS

## 2021-10-20 MED ORDER — FENTANYL-BUPIVACAINE-NACL 0.5-0.125-0.9 MG/250ML-% EP SOLN: 12.0000 mL/h | EPIDURAL | Status: DC | PRN

## 2021-10-20 MED ORDER — PHENYLEPHRINE 40 MCG/ML (10ML) SYRINGE FOR IV PUSH (FOR BLOOD PRESSURE SUPPORT): 80.0000 ug | PREFILLED_SYRINGE | INTRAVENOUS | Status: DC | PRN

## 2021-10-20 MED ORDER — SOD CITRATE-CITRIC ACID 500-334 MG/5ML PO SOLN: 30.0000 mL | ORAL | Status: DC | PRN

## 2021-10-20 MED ORDER — DIPHENHYDRAMINE HCL 50 MG/ML IJ SOLN: 12.5000 mg | INTRAMUSCULAR | Status: DC | PRN

## 2021-10-20 MED ORDER — LIDOCAINE HCL (PF) 1 % IJ SOLN: 30.0000 mL | INTRAMUSCULAR | Status: DC | PRN

## 2021-10-20 MED ORDER — OXYTOCIN-SODIUM CHLORIDE 30-0.9 UT/500ML-% IV SOLN
1.0000 m[IU]/min | INTRAVENOUS | Status: DC
  Administered 2021-10-20: 2 m[IU]/min via INTRAVENOUS
  Filled 2021-10-20: qty 500

## 2021-10-20 MED ORDER — BUTORPHANOL TARTRATE 1 MG/ML IJ SOLN
1.0000 mg | INTRAMUSCULAR | Status: DC | PRN
  Administered 2021-10-20: 1 mg via INTRAVENOUS
  Filled 2021-10-20: qty 1

## 2021-10-20 MED ORDER — MISOPROSTOL 200 MCG PO TABS
ORAL_TABLET | ORAL | Status: AC
  Filled 2021-10-20: qty 4

## 2021-10-20 MED ORDER — ACETAMINOPHEN 325 MG PO TABS
650.0000 mg | ORAL_TABLET | ORAL | Status: DC | PRN
Start: 2021-10-20 — End: 2021-10-21

## 2021-10-20 MED ORDER — LACTATED RINGERS IV SOLN: 500.0000 mL | INTRAVENOUS | Status: DC | PRN

## 2021-10-20 MED ORDER — ONDANSETRON HCL 4 MG/2ML IJ SOLN
4.0000 mg | Freq: Four times a day (QID) | INTRAMUSCULAR | Status: DC | PRN
Start: 2021-10-20 — End: 2021-10-21
  Administered 2021-10-20: 4 mg via INTRAVENOUS
  Filled 2021-10-20: qty 2

## 2021-10-20 MED ORDER — METRONIDAZOLE 500 MG PO TABS
500.0000 mg | ORAL_TABLET | Freq: Two times a day (BID) | ORAL | Status: DC
  Administered 2021-10-20 – 2021-10-22 (×4): 500 mg via ORAL
  Filled 2021-10-20 (×4): qty 1

## 2021-10-20 MED ORDER — TERBUTALINE SULFATE 1 MG/ML IJ SOLN: 0.2500 mg | Freq: Once | INTRAMUSCULAR | Status: DC | PRN

## 2021-10-20 MED ORDER — AMMONIA AROMATIC IN INHA
RESPIRATORY_TRACT | Status: AC
  Filled 2021-10-20: qty 10

## 2021-10-20 NOTE — OB Triage Note (Signed)
Patient sent from MD office for NST due to non reactive in office. ?

## 2021-10-20 NOTE — H&P (Addendum)
OB History & Physical  ? ?History of Present Illness:  ? ?Chief Complaint: NRNST in office ? ?HPI:  ?Colleen Steele is a 21 y.o. 743P1011 female at 7268w2d dated by LMP.  [facility] ? ?Reports active fetal movement  ?Contractions:  irritability ?LOF/SROM: none ?Vaginal bleeding: none ? ?Factors complicating pregnancy:  ?Obesity pre-pregnancy BMI 32.9 ?STI infection in pregnancy positive for Trich and chlamydia treated on 03/12/22 ?Hyperemesis gravidarum ?CHTN ?THC exposure ?Abnormal TSH ? ?Patient Active Problem List  ? Diagnosis Date Noted  ? Chronic hypertension in pregnancy 10/20/2021  ? Encounter for induction of labor 10/20/2021  ? Non-reactive NST (non-stress test) 10/13/2021  ? Shortness of breath 09/29/2021  ? Decreased fetal movement 08/08/2021  ? Migraine headache 05/22/2018  ? Trichomonal infection 04/20/2018  ? Gonorrhea 04/20/2018  ? Chlamydia 04/20/2018  ? Supervision of high risk pregnancy in third trimester 03/01/2018  ? ? ? ?Maternal Medical History:  ? ?Past Medical History:  ?Diagnosis Date  ? Asthma   ? Chlamydia   ? Chronic kidney disease   ? Diarrhea   ? Environmental allergies   ? Gonorrhea   ? Hypertension   ? Vomiting   ? ? ?Past Surgical History:  ?Procedure Laterality Date  ? MOUTH SURGERY    ? NO PAST SURGERIES    ? ? ?No Known Allergies ? ?Prior to Admission medications   ?Medication Sig Start Date End Date Taking? Authorizing Provider  ?acetaminophen (TYLENOL) 500 MG tablet Take 1,000 mg by mouth every 6 (six) hours as needed.    [provider]  ?calcium carbonate (TUMS - DOSED IN MG ELEMENTAL CALCIUM) 500 MG chewable tablet Chew 2 tablets (400 mg of elemental calcium total) by mouth every 4 (four) hours as needed for indigestion. 08/08/21   Janyce LlanosWilson, Danielle Renee, CNM  ?Prenatal Vit-Fe Fumarate-FA (MULTIVITAMIN-PRENATAL) 27-0.8 MG TABS tablet Take 1 tablet by mouth daily at 12 noon.    [provider]  ?ferrous sulfate  (FERROUSUL) 325 (65 FE) MG tablet Take 1 tablet (325 mg total) by mouth daily with breakfast. ?Patient not taking: Reported on 09/18/2018 07/02/18 05/02/19  Raelyn Moraawson, Rolitta, CNM  ? ? ? ?[facility] ? ?Social History: She  reports that she has never smoked. She has never used smokeless tobacco. She reports that she does not currently use alcohol. She reports that she does not currently use drugs after having used the following drugs: Marijuana. ? ?Family History: family history includes Diverticulitis in her sister; Hypertension in her mother.  ? ?Review of Systems: A full review of systems was performed and negative except as noted in the HPI.   ? ? ?Physical Exam:  ?Vital Signs: BP 122/80 (BP Location: Right Arm)   Pulse 77   Temp 98.3 ?F (36.8 ?C) (Oral)   Resp 19   LMP 01/25/2021  ?Physical Exam ?Constitutional:   ?   Appearance: Normal appearance.  ?HENT:  ?   Head: Normocephalic.  ?Cardiovascular:  ?   Rate and Rhythm: Normal rate and regular rhythm.  ?Abdominal:  ?   Palpations: Abdomen is soft.  ?   Comments: gravid  ?Genitourinary: ?   General: Normal vulva.  ?   Vagina: Vaginal discharge present.  ?   Cervix: Dilated.  ?   Uterus: Normal.   ?   Rectum: Normal.  ?Musculoskeletal:     ?   General: Normal range of motion.  ?   Cervical  back: Normal range of motion and neck supple.  ?Skin: ?   General: Skin is warm and dry.  ?Neurological:  ?   Mental Status: She is alert and oriented to person, place, and time.  ?Psychiatric:     ?   Mood and Affect: Mood normal.     ?   Thought Content: Thought content normal.     ?   Judgment: Judgment normal.  ? ? ?General: no acute distress.  ?HEENT: normocephalic, atraumatic ?Heart: regular rate & rhythm.  No murmurs/rubs/gallops ?Lungs: clear to auscultation bilaterally, normal respiratory effort ?Abdomen: soft, gravid, non-tender;  EFW: 7 lbs ?Pelvic:  ? External: Normal external female genitalia ? Cervix: Dilation: 2 / Effacement (%):  Thick /    ?  ?Extremities: non-tender, symmetric, no edema bilaterally.  DTRs: +2  ?Neurologic: Alert & oriented x 3.   ? ?No results found for this or any previous visit (from the past 24 hour(s)). ? ?Pertinent Results:  ?Prenatal Labs: ?Blood type/Rh O pos  ?Antibody screen neg  ?Rubella Immune  ?Varicella Immune  ?RPR NR  ?HBsAg Neg  ?HIV NR  ?GC neg  ?Chlamydia neg  ?Genetic screening cfDNA negative   ?1 hour GTT 108  ?3 hour GTT N/A  ?GBS neg  ? ?FHT:  FHR: 135 bpm, variability: moderate,  accelerations:  Present,  decelerations:  Absent ?Category/reactivity:  Category I ?UC:   irregular, every 2-7 minutes ?  ?Cephalic by Leopolds and SVE  ? ?DG Chest 2 View ? ?Result Date: 09/29/2021 ?CLINICAL DATA:  Shortness of breath EXAM: CHEST - 2 VIEW COMPARISON:  01/26/2020 FINDINGS: The heart size and mediastinal contours are within normal limits. Both lungs are clear. The visualized skeletal structures are unremarkable. IMPRESSION: No active cardiopulmonary disease. Electronically Signed   By: Jasmine Pang M.D.   On: 09/29/2021 18:26  ? ?CT Angio Chest Pulmonary Embolism (PE) W or WO Contrast ? ?Result Date: 09/29/2021 ?CLINICAL DATA:  Worsening shortness of breath, increased work of breathing. Possible pulmonary embolism. Currently pregnant. EXAM: CT ANGIOGRAPHY CHEST WITH CONTRAST TECHNIQUE: Multidetector CT imaging of the chest was performed using the standard protocol during bolus administration of intravenous contrast. Multiplanar CT image reconstructions and MIPs were obtained to evaluate the vascular anatomy. RADIATION DOSE REDUCTION: This exam was performed according to the departmental dose-optimization program which includes automated exposure control, adjustment of the mA and/or kV according to patient size and/or use of iterative reconstruction technique. CONTRAST:  41mL OMNIPAQUE IOHEXOL 350 MG/ML SOLN COMPARISON:  No prior cross-sectional imaging for comparison. Most recent PA and lateral chest was earlier  today with no significant findings. FINDINGS: Cardiovascular: The cardiac size is normal. There is no pericardial effusion. The aorta and great vessels are normal. The coronary artery calcification is seen. The pulmonary arteries are normal in caliber and do not show appreciable embolic filling defects, but there is suboptimal opacification of the small subsegmental arteries in the anterior lower lung fields. Mediastinum/Nodes: There is an 8.5 mm low-density nodule posteriorly in the left lobe of the thyroid gland. The remainder of the thyroid is unremarkable. There is a small hiatal hernia with mild thickening of the distal thoracic esophagus. No tracheal filling defect is seen. There is no intrathoracic or axillary adenopathy. Lungs/Pleura: Lungs are clear. No pleural effusion or pneumothorax. The central airways are clear. Upper Abdomen: No acute abnormality. Musculoskeletal: No chest wall abnormality. No acute or significant osseous findings. There is bilateral os acromiale variant. Review of the MIP images confirms  the above findings. IMPRESSION: 1. No acute chest CT or CTA findings, but with suboptimal opacification of small arteries in the anterior lower lung fields. 2. Hiatal hernia with mildly thickened distal thoracic esophagus, probably reflux esophagitis. 3. 8.5 mm left thyroid nodule.  No imaging follow-up required. 4. Bilateral os acromiale variant. Electronically Signed   By: Almira Bar M.D.   On: 09/29/2021 23:34  ? ?US FETAL BPP WO NON STRESS ? ?Result Date: 09/29/2021 ?CLINICAL DATA:  Shortness of breath EXAM: BIOPHYSICAL PROFILE FINDINGS: Number of Fetuses: 1 Heart Rate: 159 bpm Presentation: Cephalic Movement: 2 time: 21 minutes Breathing: 2 Tone: 2 Amniotic Fluid: 2 Total Score: 8 IMPRESSION: Normal biophysical profile. Electronically Signed   By: Jasmine Pang M.D.   On: 09/29/2021 19:29   ? ?Assessment:  ?Braylinn Curd is a 21 y.o. G25P1011 female at [redacted]w[redacted]d with CHTN, obesity, THC  use,Hyperemesis gravidarum, and abnormal TSH.  ? ?Plan:  ?1. Admit to Labor & Delivery; consents reviewed and obtained ?- Covid admission screen  ? ?2. Fetal Well being  ?- Fetal Tracing: category 1  ?- Group B Streptoco

## 2021-10-20 NOTE — Progress Notes (Signed)
Labor Progress Note ? ?Colleen Steele is a 21 y.o. G3P1011 at [redacted]w[redacted]d by LMP admitted for induction of labor due to [facility], NRNST. ? ?Subjective:  feeling pain with uterine contractions, requested IV pain medication ? ?Objective: ?BP 122/80 (BP Location: Right Arm)   Pulse 77   Temp 98.3 ?F (36.8 ?C) (Oral)   Resp 19   Ht 5\' 2"  (1.575 m)   Wt 78 kg   LMP 01/25/2021   BMI 31.46 kg/m?  ?Notable VS details:  ? ?Fetal Assessment: ?FHT:  FHR: 120 bpm, variability: moderate,  accelerations:  Present,  decelerations:  Absent- tracing maternal at times ?Category/reactivity:  Category I ?UC:   regular, every 2-4 minutes ?SVE:    ?Membrane status: AROM@ 2100 ?Amniotic color: Clear ? ?Labs: ?Lab Results  ?Component Value Date  ? WBC 12.6 (H) 10/20/2021  ? HGB 11.3 (L) 10/20/2021  ? HCT 33.6 (L) 10/20/2021  ? MCV 88.9 10/20/2021  ? PLT 239 10/20/2021  ? ? ?Assessment / Plan: ?Induction of labor due to [facility],  progressing well on pitocin ? ?Labor:  progressing normally. Cervical balloon out with tug, AROM performed ?Preeclampsia:  labs stable ?Fetal Wellbeing:  Category I ?Pain Control:  IV pain meds ?[name] ?Anticipated MOD:  NSVD ? ?Ana Woodroof [facility], CNM ?10/20/2021, 9:39 PM ? ? ? ? ? ? ?  ?

## 2021-10-20 NOTE — Anesthesia Preprocedure Evaluation (Deleted)
Anesthesia Evaluation  ?Patient identified by MRN, date of birth, ID band ?Patient awake ? ? ? ?Reviewed: ?Allergy & Precautions, NPO status , Patient's Chart, lab work & pertinent test results ? ?History of Anesthesia Complications ?Negative for: history of anesthetic complications ? ?Airway ?Mallampati: I ? ? ?Neck ROM: Full ? ? ? Dental ?no notable dental hx. ? ?  ?Pulmonary ?asthma ,  ?  ?Pulmonary exam normal ?breath sounds clear to auscultation ? ? ? ? ? ? Cardiovascular ?hypertension, Normal cardiovascular exam ?Rhythm:Regular Rate:Normal ? ? ?  ?Neuro/Psych ?negative neurological ROS ?   ? GI/Hepatic ?Hyperemesis gravidarum ?  ?Endo/Other  ?Obesity  ? Renal/GU ?Renal disease (CKD)  ? ?  ?Musculoskeletal ? ? Abdominal ?  ?Peds ? Hematology ?negative hematology ROS ?(+)   ?Anesthesia Other Findings ? ? Reproductive/Obstetrics ? ?  ? ? ? ? ? ? ? ? ? ? ? ? ? ?  ?  ? ? ? ? ? ? ? ?Anesthesia Physical ?Anesthesia Plan ? ?ASA: 2 ? ?Anesthesia Plan: Epidural  ? ?Post-op Pain Management:   ? ?Induction:  ? ?PONV Risk Score and Plan: 2 and Treatment may vary due to age or medical condition ? ?Airway Management Planned: Natural Airway ? ?Additional Equipment:  ? ?Intra-op Plan:  ? ?Post-operative Plan:  ? ?Informed Consent: I have reviewed the patients History and Physical, chart, labs and discussed the procedure including the risks, benefits and alternatives for the proposed anesthesia with the patient or authorized representative who has indicated his/her understanding and acceptance.  ? ? ? ?Dental Advisory Given ? ?Plan Discussed with:  ? ?Anesthesia Plan Comments: (Patient reports no bleeding problems and no anticoagulant use. ? ? ?Patient consented for risks of anesthesia including but not limited to:  ?- adverse reactions to medications ?- risk of bleeding, infection and or nerve damage from epidural that could lead to paralysis ?- risk of headache or failed epidural ?- nerve  damage due to positioning ?- that if epidural is used for C-section that there is a chance of epidural failure requiring spinal placement or conversion to GA ?- Damage to heart, brain, lungs, other parts of body or loss of life ? ?Patient voiced understanding. ? ?After consent, patient changed her mind about epidural -- does not feel she will be able to sit still through pain of contractions.  Nurse at bedside for discussion.)  ? ? ? ? ? ?Anesthesia Quick Evaluation ? ?

## 2021-10-20 NOTE — Progress Notes (Signed)
Labor Progress Note ? ?Colleen Steele is a 21 y.o. G3P1011 at [redacted]w[redacted]d by LMP admitted for induction of labor due to Non-reactive NST and CHTN. ? ?Subjective: resting in bed, feeling cramping with cervical balloon in place, having some nausea. ? ?Objective: ?BP 122/80 (BP Location: Right Arm)   Pulse 77   Temp 98.3 ?F (36.8 ?C) (Oral)   Resp 19   Ht 5\' 2"  (1.575 m)   Wt 78 kg   LMP 01/25/2021   BMI 31.46 kg/m?  ?Notable VS details:  ? ?Fetal Assessment: ?FHT:  FHR: 125 bpm, variability: moderate,  accelerations:  Present,  decelerations:  Present intermittent shallow variables ?Category/reactivity:  Category II ?UC:   irregular, every 2-5 minutes ?SVE:   2.5/60/-2 ?Membrane status: intact ?Amniotic color: N/A ? ?Labs: ?Lab Results  ?Component Value Date  ? WBC 12.6 (H) 10/20/2021  ? HGB 11.3 (L) 10/20/2021  ? HCT 33.6 (L) 10/20/2021  ? MCV 88.9 10/20/2021  ? PLT 239 10/20/2021  ? ? ?Assessment / Plan: ? ?-Cook's Cervical Cath placed ?-Low dose Pitocin started ?-Oral flagyl given for BV ?-IV Zofran for nausea ? ?Labor: Progressing on Pitocin, will continue to increase then AROM ?Preeclampsia:  labs stable ?Fetal Wellbeing:  Category II ?Pain Control:  Labor support without medications ?I/D:   GBS neg ?Anticipated MOD:  NSVD ? ?Shayaan Parke LUCY 10/22/2021, CNM ?10/20/2021, 7:17 PM ? ? ? ? ? ? ?  ?

## 2021-10-20 NOTE — Progress Notes (Signed)
?  10/20/21 2300  ?Clinical Encounter Type  ?Visited With Patient and family together  ?Visit Type Initial  ?Referral From Nurse  ?Consult/Referral To Chaplain  ? ?Chaplain responded to nurse consult. Chaplain met with patient and partner. Chaplain provided compassionate presence and reflective listening. Chaplain provided AD paperwork as requested. Patient and family appreciated Chaplain visit.  ?

## 2021-10-21 LAB — CBC
HCT: 32 % — ABNORMAL LOW (ref 36.0–46.0)
Hemoglobin: 10.9 g/dL — ABNORMAL LOW (ref 12.0–15.0)
MCH: 30.1 pg (ref 26.0–34.0)
MCHC: 34.1 g/dL (ref 30.0–36.0)
MCV: 88.4 fL (ref 80.0–100.0)
Platelets: 226 10*3/uL (ref 150–400)
RBC: 3.62 MIL/uL — ABNORMAL LOW (ref 3.87–5.11)
RDW: 12.8 % (ref 11.5–15.5)
WBC: 20.7 10*3/uL — ABNORMAL HIGH (ref 4.0–10.5)
nRBC: 0 % (ref 0.0–0.2)

## 2021-10-21 LAB — RPR: RPR Ser Ql: NONREACTIVE

## 2021-10-21 MED ORDER — SIMETHICONE 80 MG PO CHEW: 80.0000 mg | CHEWABLE_TABLET | ORAL | Status: DC | PRN

## 2021-10-21 MED ORDER — SODIUM CHLORIDE 0.9% FLUSH
3.0000 mL | Freq: Two times a day (BID) | INTRAVENOUS | Status: DC
  Administered 2021-10-21 – 2021-10-22 (×3): 3 mL via INTRAVENOUS

## 2021-10-21 MED ORDER — TETANUS-DIPHTH-ACELL PERTUSSIS 5-2.5-18.5 LF-MCG/0.5 IM SUSY
0.5000 mL | PREFILLED_SYRINGE | Freq: Once | INTRAMUSCULAR | Status: DC
  Filled 2021-10-21: qty 0.5

## 2021-10-21 MED ORDER — ZOLPIDEM TARTRATE 5 MG PO TABS: 5.0000 mg | ORAL_TABLET | Freq: Every evening | ORAL | Status: DC | PRN

## 2021-10-21 MED ORDER — IBUPROFEN 600 MG PO TABS
600.0000 mg | ORAL_TABLET | Freq: Four times a day (QID) | ORAL | Status: DC
  Administered 2021-10-21 – 2021-10-22 (×5): 600 mg via ORAL
  Filled 2021-10-21 (×6): qty 1

## 2021-10-21 MED ORDER — ONDANSETRON HCL 4 MG/2ML IJ SOLN: 4.0000 mg | INTRAMUSCULAR | Status: DC | PRN

## 2021-10-21 MED ORDER — BENZOCAINE-MENTHOL 20-0.5 % EX AERO
1.0000 | INHALATION_SPRAY | CUTANEOUS | Status: DC | PRN
Start: 2021-10-21 — End: 2021-10-22
  Administered 2021-10-21: 1 via TOPICAL
  Filled 2021-10-21: qty 56

## 2021-10-21 MED ORDER — FERROUS SULFATE 325 (65 FE) MG PO TABS
325.0000 mg | ORAL_TABLET | Freq: Two times a day (BID) | ORAL | Status: DC
  Administered 2021-10-22: 325 mg via ORAL
  Filled 2021-10-21: qty 1

## 2021-10-21 MED ORDER — ONDANSETRON HCL 4 MG PO TABS
4.0000 mg | ORAL_TABLET | ORAL | Status: DC | PRN
  Administered 2021-10-21: 4 mg via ORAL
  Filled 2021-10-21: qty 1

## 2021-10-21 MED ORDER — DIBUCAINE (PERIANAL) 1 % EX OINT: 1.0000 "application " | TOPICAL_OINTMENT | CUTANEOUS | Status: DC | PRN

## 2021-10-21 MED ORDER — BISACODYL 10 MG RE SUPP
10.0000 mg | Freq: Every day | RECTAL | Status: DC | PRN
  Filled 2021-10-21: qty 1

## 2021-10-21 MED ORDER — SENNOSIDES-DOCUSATE SODIUM 8.6-50 MG PO TABS
2.0000 | ORAL_TABLET | ORAL | Status: DC
  Administered 2021-10-21 – 2021-10-22 (×2): 2 via ORAL
  Filled 2021-10-21 (×2): qty 2

## 2021-10-21 MED ORDER — OXYCODONE HCL 5 MG PO TABS: 5.0000 mg | ORAL_TABLET | ORAL | Status: DC | PRN

## 2021-10-21 MED ORDER — SODIUM CHLORIDE 0.9% FLUSH: 3.0000 mL | INTRAVENOUS | Status: DC | PRN

## 2021-10-21 MED ORDER — COCONUT OIL OIL: 1.0000 "application " | TOPICAL_OIL | Status: DC | PRN

## 2021-10-21 MED ORDER — DIPHENHYDRAMINE HCL 25 MG PO CAPS: 25.0000 mg | ORAL_CAPSULE | Freq: Four times a day (QID) | ORAL | Status: DC | PRN

## 2021-10-21 MED ORDER — SODIUM CHLORIDE 0.9 % IV SOLN
250.0000 mL | INTRAVENOUS | Status: DC | PRN
Start: 2021-10-21 — End: 2021-10-22

## 2021-10-21 MED ORDER — PRENATAL MULTIVITAMIN CH
1.0000 | ORAL_TABLET | Freq: Every day | ORAL | Status: DC
  Administered 2021-10-21: 1 via ORAL
  Filled 2021-10-21 (×2): qty 1

## 2021-10-21 MED ORDER — ACETAMINOPHEN 325 MG PO TABS
650.0000 mg | ORAL_TABLET | ORAL | Status: DC | PRN
  Administered 2021-10-21: 650 mg via ORAL
  Filled 2021-10-21: qty 2

## 2021-10-21 MED ORDER — FLEET ENEMA 7-19 GM/118ML RE ENEM: 1.0000 | ENEMA | Freq: Every day | RECTAL | Status: DC | PRN

## 2021-10-21 MED ORDER — WITCH HAZEL-GLYCERIN EX PADS
1.0000 | MEDICATED_PAD | CUTANEOUS | Status: DC | PRN
Start: 2021-10-21 — End: 2021-10-22

## 2021-10-21 MED ORDER — TETANUS-DIPHTH-ACELL PERTUSSIS 5-2.5-18.5 LF-MCG/0.5 IM SUSY
0.5000 mL | PREFILLED_SYRINGE | INTRAMUSCULAR | Status: DC | PRN
Start: 2021-10-21 — End: 2021-10-22
  Filled 2021-10-21: qty 0.5

## 2021-10-21 NOTE — Lactation Note (Signed)
This note was copied from a baby's chart. ?Lactation Consultation Note ? ?Patient Name: Colleen Steele ?Today's Date: 10/21/2021 ?Reason for consult: Initial assessment;Term;1st time breastfeeding;RN request ?Age:23 hours ? ?Maternal Data ? This is mom's 2nd baby and first time breastfeeding. Mom's stated choice is breastmilk and formula. She has been solely breastfeeding thus far.  ? ?Feeding ?Mother's Current Feeding Choice: Breast Milk (Mom has been solely breastfeeding.) ?Per report baby is latching and breastfeeding, is voiding and has 2 stools since birth. ? ?LATCH Score ?Latch:Baby was able to latch but he readily became sleepy at this feeding attempt ? ?Audible swallowing: None at this feeding. ? ?Type of Nipple: Everted at rest and after stimulation ? ?Comfort (Breast/Nipple): Soft / non-tender ? ?Hold (Positioning): Assistance needed to correctly position infant at breast and maintain latch. ? ?Latch score:6 ? ?Interventions ?Interventions: Assisted with latch;Adjust position;Support pillows;Position options ?Skin to skin, education regarding breastfeeding basics, how to know the baby is getting enough, how the body knows to make milk, 8-12 feeds/24 hours. Mom concerned baby was not getting enough and was considering to offer formula supplement. For now mom will continue to breastfeed as she was reassured after information provided. Support and encouragement provided. If she decides to also offer formula supplement encouraged mom to try to offer baby breast before bottle feeding. ? ?Discharge ?Discharge Education: Other (comment) (Mom will bring baby to [place] clinic. Provided mom with [facility] office number for outpatient assistance as needed.) ?Pump:  (Mom does not have a pump. She would like to have a manual pump for home use.) ? ?Consult Status ?Consult Status: PRN ? ?Update provided to care nurse. ? ?Fuller Song ?10/21/2021, 1:38 PM ? ? ? ?

## 2021-10-21 NOTE — Discharge Summary (Signed)
Obstetrical Discharge Summary ? ?Patient Name: Colleen Steele ?DOB: 05/10/2001 ?MRN: [id number] ? ?Date of Admission: 10/20/2021 ?Date of Delivery: 10/21/2021 ? ?Delivered by: Avelino Leeds, CNM  ?Date of Discharge: 10/22/2021 ? ?Primary OB: [facility] ?[id number] last menstrual period was 01/25/2021. ?EDC Estimated Date of Delivery: 11/01/21 ?Gestational Age at Delivery: [redacted]w[redacted]d  ? ?Antepartum complications:  ?Obesity pre-pregnancy BMI 32.9 ?STI infection in pregnancy positive for Trich and chlamydia treated on 03/12/22 ?Hyperemesis gravidarum ?CHTN ?THC exposure ?Abnormal TSH ? ?Admitting Diagnosis: IOL for CHTN ?Secondary Diagnosis: ?Patient Active Problem List  ? Diagnosis Date Noted  ? Chronic hypertension in pregnancy 10/20/2021  ? Encounter for induction of labor 10/20/2021  ? Non-reactive NST (non-stress test) 10/13/2021  ? Shortness of breath 09/29/2021  ? Decreased fetal movement 08/08/2021  ? Migraine headache 05/22/2018  ? Trichomonal infection 04/20/2018  ? Gonorrhea 04/20/2018  ? Chlamydia 04/20/2018  ? Supervision of high risk pregnancy in third trimester 03/01/2018  ? ? ?Augmentation: AROM, Pitocin, and IP Foley ?Complications: None ?Intrapartum complications/course: Category 2 FHT ?Delivery Type: spontaneous vaginal delivery ?Anesthesia: epidural ?Placenta: spontaneous ?Laceration: none ?Episiotomy: none ?Newborn Data: ?Live born female  ?Birth Weight:  7lb 1.6oz ?APGAR: 7, 9 ? ?Newborn Delivery   ?Birth date/time: 10/21/2021 00:08:00 ?Delivery type: Vaginal, Spontaneous ?  ?  ? ?Postpartum Procedures:   ?Edinburgh:  ? ?  10/21/2021  ?  4:56 PM 10/18/2018  ?  2:39 PM 09/07/2018  ?  7:00 AM  ?Flavia Shipper Postnatal Depression Scale Screening Tool  ?I have been able to laugh and see the funny side of things. 0 0 0  ?I have looked forward with enjoyment to things. 0 0 0  ?I have blamed myself unnecessarily when things went wrong. 0 0 0  ?I have been anxious or worried for no good reason. 0 0 0  ?I  have felt scared or panicky for no good reason. 0 0 0  ?Things have been getting on top of me. 0 1 0  ?I have been so unhappy that I have had difficulty sleeping. 0 0 0  ?I have felt sad or miserable. 0 0 0  ?I have been so unhappy that I have been crying. 0 0 0  ?The thought of harming myself has occurred to me. 0 0 0  ?Edinburgh Postnatal Depression Scale Total 0 1 0  ?  ? ?Post partum course:   ?Patient had an uncomplicated postpartum course.  By time of discharge on PPD#1, her pain was controlled on oral pain medications; she had appropriate lochia and was ambulating, voiding without difficulty and tolerating regular diet.  She was deemed stable for discharge to home.   ? ? ?Discharge Physical Exam:   ?BP 133/76   Pulse 60 Comment: manually counted  Temp 98.8 ?F (37.1 ?C) (Oral)   Resp 18   Ht 5\' 2"  (1.575 m)   Wt 78 kg   LMP 01/25/2021   SpO2 99%   BMI 31.46 kg/m?  ? ?General: NAD ?CV: RRR ?Pulm: CTABL, nl effort ?ABD: s/nd/nt, fundus firm and below the umbilicus ?Lochia: moderate ?Perineum: minimal edema/laceration hemostatic ?DVT Evaluation: LE non-ttp, no evidence of DVT on exam. ? ?Hemoglobin  ?Date Value Ref Range Status  ?10/22/2021 10.3 (L) 12.0 - 15.0 g/dL Final  ?06/20/2018 10.7 (L) 11.1 - 15.9 g/dL Final  ? ?HCT  ?Date Value Ref Range Status  ?10/22/2021 30.5 (L) 36.0 - 46.0 % Final  ? ?Hematocrit  ?Date Value Ref Range Status  ?06/20/2018 30.8 (L)  34.0 - 46.6 % Final  ? ?Continue BV treatment until 7 days of treatment. ? ?Disposition: stable, discharge to home. ?Baby Feeding: breastmilk ?Baby Disposition: home with mom ? ?Rh Immune globulin given: N/A ?Rubella vaccine given: Immune ?Varivax vaccine given: Immune ?Flu vaccine given in AP or PP setting: declined ?Tdap vaccine given in AP or PP setting: received ? ?Contraception: Nexplanon ? ?Prenatal Labs:  ?Blood type/Rh O pos  ?Antibody screen neg  ?Rubella Immune  ?Varicella Immune  ?RPR NR  ?HBsAg Neg  ?HIV NR  ?GC neg  ?Chlamydia neg   ?Genetic screening cfDNA negative   ?1 hour GTT 108  ?3 hour GTT N/A  ?GBS neg  ?  ? ?Plan:  ?Colleen Steele was discharged to home in good condition. ?Follow-up appointment with delivering provider in 6 weeks. ? ?Discharge Medications: ?Allergies as of 10/22/2021   ?No Known Allergies ?  ? ?  ?Medication List  ?  ? ?TAKE these medications   ? ?acetaminophen 500 MG tablet ?Commonly known as: TYLENOL ?Take 1,000 mg by mouth every 6 (six) hours as needed. ?  ?benzocaine-Menthol 20-0.5 % Aero ?Commonly known as: DERMOPLAST ?Apply 1 application. topically as needed for irritation (perineal discomfort). ?  ?calcium carbonate 500 MG chewable tablet ?Commonly known as: TUMS - dosed in mg elemental calcium ?Chew 2 tablets (400 mg of elemental calcium total) by mouth every 4 (four) hours as needed for indigestion. ?  ?ferrous sulfate 325 (65 FE) MG tablet ?Take 1 tablet (325 mg total) by mouth 2 (two) times daily with a meal. ?  ?ibuprofen 600 MG tablet ?Commonly known as: ADVIL ?Take 1 tablet (600 mg total) by mouth every 6 (six) hours. ?  ?metroNIDAZOLE 500 MG tablet ?Commonly known as: FLAGYL ?Take 1 tablet (500 mg total) by mouth every 12 (twelve) hours for 5 days. ?  ?multivitamin-prenatal 27-0.8 MG Tabs tablet ?Take 1 tablet by mouth daily at 12 noon. ?  ?witch hazel-glycerin pad ?Commonly known as: TUCKS ?Apply 1 application. topically as needed for hemorrhoids (for pain). ?  ? ?  ? ? ? Follow-up Information   ? ? Avelino Leeds Arlyn Leak, CNM Follow up in 2 week(s).   ?Specialty: Obstetrics ?Why: BP check ?Contact information: ?[address] ?[place] [address] ?[phone] ? ? ?  ?  ? ? Avelino Leeds Arlyn Leak, CNM Follow up in 6 week(s).   ?Specialty: Obstetrics ?Why: 6wk postpartum ?Contact information: ?[address] ?[place] [address] ?[phone] ? ? ?  ?  ? ?  ?  ? ?  ? ? ?Signed: ?Gertie Fey, CNM ?10/22/2021 ?8:32 AM ? ?

## 2021-10-21 NOTE — Progress Notes (Signed)
Post Partum Day 0 ?Subjective: ?Doing well, no complaints.  Tolerating regular diet, pain with PO meds, voiding and ambulating without difficulty. ? ?No CP SOB Fever,Chills, N/V or leg pain; denies nipple or breast pain ?no HA change of vision, RUQ/epigastric pain ? ?Objective: ?BP 127/71 (BP Location: Right Arm)   Pulse 65   Temp 98.2 ?F (36.8 ?C)   Resp 16   Ht 5\' 2"  (1.575 m)   Wt 78 kg   LMP 01/25/2021   SpO2 100%   BMI 31.46 kg/m?  ?  ?Vitals:  ? 10/21/21 0028 10/21/21 0031 10/21/21 10/23/21 10/21/21 0116  ?BP: 98/73 118/67 124/70 120/69  ? 10/21/21 0132 10/21/21 0146 10/21/21 0201 10/21/21 0216  ?BP: 90/74 111/70 125/75 119/79  ? 10/21/21 0231 10/21/21 0320 10/21/21 0430 10/21/21 0753  ?BP: 115/65 (!) 118/59 120/64 127/71  ? ? ? ?Physical Exam:  ?General: NAD ?Breasts: soft/nontender ?CV: RRR ?Pulm: nl effort, CTABL ?Abdomen: soft, NT, BS x 4 ?Perineum: minimal edema, intact ?Lochia: small ?Uterine Fundus: fundus firm and 1 fb below umbilicus ?DVT Evaluation: no cords, ttp LEs  ? ?Recent Labs  ?  10/20/21 ?1810 10/21/21 ?0430  ?HGB 11.3* 10.9*  ?HCT 33.6* 32.0*  ?WBC 12.6* 20.7*  ?PLT 239 226  ? ? ?Assessment/Plan: ?21 y.o. G3P1011 postpartum day # 1 ? ?- Continue routine PP care ?- Lactation consult prn ?- Discussed contraceptive options including implant, IUDs hormonal and non-hormonal, injection, pills/ring/patch, condoms, and NFP. Pt plans Nexplanon.  ?- Acute blood loss anemia and leukocytotis, plan repeat CBC in AM. hemodynamically stable and asymptomatic; start po ferrous sulfate BID with stool softeners  ?- Immunization status: Needs tdap prior to DC ? ? ? ?Disposition: Does not desire Dc home today.  ? ? ? ?21, CNM ?10/21/2021  ?12:03 PM ? ? ? ? ?

## 2021-10-22 LAB — CBC
HCT: 30.5 % — ABNORMAL LOW (ref 36.0–46.0)
Hemoglobin: 10.3 g/dL — ABNORMAL LOW (ref 12.0–15.0)
MCH: 30.8 pg (ref 26.0–34.0)
MCHC: 33.8 g/dL (ref 30.0–36.0)
MCV: 91.3 fL (ref 80.0–100.0)
Platelets: 204 10*3/uL (ref 150–400)
RBC: 3.34 MIL/uL — ABNORMAL LOW (ref 3.87–5.11)
RDW: 13.1 % (ref 11.5–15.5)
WBC: 12.1 10*3/uL — ABNORMAL HIGH (ref 4.0–10.5)
nRBC: 0 % (ref 0.0–0.2)

## 2021-10-22 MED ORDER — BENZOCAINE-MENTHOL 20-0.5 % EX AERO: 1.0000 "application " | INHALATION_SPRAY | CUTANEOUS | Status: DC | PRN

## 2021-10-22 MED ORDER — METRONIDAZOLE 500 MG PO TABS: 500.0000 mg | ORAL_TABLET | Freq: Two times a day (BID) | ORAL | 0 refills | Status: AC

## 2021-10-22 MED ORDER — FERROUS SULFATE 325 (65 FE) MG PO TABS: 325.0000 mg | ORAL_TABLET | Freq: Two times a day (BID) | ORAL | 3 refills | Status: DC

## 2021-10-22 MED ORDER — IBUPROFEN 600 MG PO TABS: 600.0000 mg | ORAL_TABLET | Freq: Four times a day (QID) | ORAL | 0 refills | Status: DC

## 2021-10-22 MED ORDER — METRONIDAZOLE 500 MG PO TABS: 500.0000 mg | ORAL_TABLET | Freq: Two times a day (BID) | ORAL | 0 refills | Status: DC

## 2021-10-22 MED ORDER — WITCH HAZEL-GLYCERIN EX PADS: 1.0000 "application " | MEDICATED_PAD | CUTANEOUS | 12 refills | Status: DC | PRN

## 2021-10-22 NOTE — Progress Notes (Signed)
Mother discharged. Discharge instructions given. Mother verbalizes understanding. Transported by axillary.  

## 2021-10-22 NOTE — Lactation Note (Signed)
This note was copied from a baby's chart. ?Lactation Consultation Note ? ?Patient Name: Colleen Steele ?Today's Date: 10/22/2021 ?Reason for consult: Follow-up assessment;Early term 37-38.6wks ?Age:23 hours ? ?Maternal Data ?Has patient been taught Hand Expression?: Yes ?Does the patient have breastfeeding experience prior to this delivery?: No ? ?Feeding ?Mother's Current Feeding Choice: Breast Milk ?Sleepy baby, awakened by unswaddling, mom placed baby in left cradle hold and baby roots, mom shown how to support breast to get deep  latch, baby tends to suck on tongue and lets nipple fall out of mouth if breast not supported, mom shown how to assist baby with getting more of nipple in mouth, nursed 10 min and fell off breast with part of nipple pinched, on right breast pt turned to right side and baby placed beside her, shown how to latch in this position and techniques to keep baby stimulated, just had circ this am and is sleepy    ?LATCH Score ?Latch: Grasps breast easily, tongue down, lips flanged, rhythmical sucking. ? ?Audible Swallowing: Spontaneous and intermittent ? ?Type of Nipple: Everted at rest and after stimulation ? ?Comfort (Breast/Nipple): Soft / non-tender ? ?Hold (Positioning): Assistance needed to correctly position infant at breast and maintain latch. ? ?LATCH Score: 9 ? ? ?Lactation Tools Discussed/Used ?  ?[facility] name written on white board ?Interventions ?Interventions: Breast feeding basics reviewed;Assisted with latch;Skin to skin;Hand express;Adjust position;Education ? ?Discharge ?Pump: Manual ?WIC Program: Yes ? ?Consult Status ?Consult Status: PRN ? ? ? ?Ferol Luz ?10/22/2021, 12:18 PM ? ? ? ?

## 2021-10-22 NOTE — Discharge Instructions (Signed)

## 2021-10-22 NOTE — TOC Initial Note (Addendum)
Transition of Care (TOC) - Initial/Assessment Note  ? ? ?Patient Details  ?Name: Colleen Steele ?MRN: [id number] ?Date of Birth: 09-Sep-2000 ? ?Transition of Care (TOC) CM/SW Contact:    ?Niarada Cellar, RN ?Phone Number: ?10/22/2021, 10:03 AM ? ?Clinical Narrative:                 ?Spoke with patient and FOB at bedside. Patient reports no needs or concerns at this time. Has all needed equipment and plans to attend [facility] clinic for pediatric care. Strong support system including multiple family members to assist after discharge. Has 18 year old daughter at home. FOB returning to work Monday but paternal grandmother coming to stay with patient and infant. Patient reports MJ use during pregnancy due to vomiting/nausea and inability to eat. Reports she tried to stop smoking but would lose weight due to nausea. TOC will complete CPS referral for (+) UDS in infant. Both parents aware of referral. Discussed PPD and resources if needed. Mother works from home and will keep infant with her. No daycare needed. Active with WIC services.  ? ?  ?  ? ? ?Patient Goals and CMS Choice ?  ?  ?  ? ?Expected Discharge Plan and Services ?  ?  ?  ?  ?  ?                ?  ?  ?  ?  ?  ?  ?  ?  ?  ?  ? ?Prior Living Arrangements/Services ?  ?  ?  ?       ?  ?  ?  ?  ? ?Activities of Daily Living ?Home Assistive Devices/Equipment: None ?ADL Screening (condition at time of admission) ?Patient's cognitive ability adequate to safely complete daily activities?: Yes ?Is the patient deaf or have difficulty hearing?: No ?Does the patient have difficulty seeing, even when wearing glasses/contacts?: No ?Does the patient have difficulty concentrating, remembering, or making decisions?: No ?Patient able to express need for assistance with ADLs?: No ?Does the patient have difficulty dressing or bathing?: No ?Independently performs ADLs?: Yes (appropriate for developmental age) ?Does the patient have difficulty walking or climbing stairs?: No ?Weakness  of Legs: None ?Weakness of Arms/Hands: None ? ?Permission Sought/Granted ?  ?  ?   ?   ?   ?   ? ?Emotional Assessment ?  ?  ?  ?  ?  ?  ? ?Admission diagnosis:  Encounter for induction of labor [Z34.90] ?Patient Active Problem List  ? Diagnosis Date Noted  ? Chronic hypertension in pregnancy 10/20/2021  ? Encounter for induction of labor 10/20/2021  ? Non-reactive NST (non-stress test) 10/13/2021  ? Shortness of breath 09/29/2021  ? Decreased fetal movement 08/08/2021  ? Migraine headache 05/22/2018  ? Trichomonal infection 04/20/2018  ? Gonorrhea 04/20/2018  ? Chlamydia 04/20/2018  ? Supervision of high risk pregnancy in third trimester 03/01/2018  ? ?PCP:  Pcp, No ?Pharmacy:   ?[id number] [place], [place] - [address] AT [facility] OF [address] ?[address] ?[place] [address] ?Phone: [phone] Fax: [phone] ? ?[facility] [phone] - [place], [place] - [phone] [facility] [address] AT [facility] OF [address] ?[address] ?[place] [address] ?Phone: [phone] Fax: [phone] ? ?[facility] #[id number] [place] Rough, [place] - [address] AT [facility] OF [address] ?[address] ?[place] [address] ?Phone: [phone] Fax:  [phone] ? ?[facility] #[id number] - [place], [place] - [address] AT [facility] OF [address] ?[address] ?[place] [address] ?Phone: [phone] Fax: [phone] ? ? ? ? ?Social Determinants of Health (SDOH) Interventions ?  ? ?Readmission Risk Interventions ?   ? View : No data to display.  ?  ?  ?  ? ? ? ?

## 2021-11-01 ENCOUNTER — Inpatient Hospital Stay (HOSPITAL_COMMUNITY): Admit: 2021-11-01 | Payer: Self-pay

## 2023-07-02 ENCOUNTER — Encounter: Payer: Self-pay | Admitting: Emergency Medicine

## 2023-07-02 ENCOUNTER — Other Ambulatory Visit: Payer: Self-pay

## 2023-07-02 ENCOUNTER — Emergency Department
Admission: EM | Admit: 2023-07-02 | Discharge: 2023-07-02 | Disposition: A | Discharge status: Home / Self Care | Payer: Medicaid Other | Attending: Emergency Medicine | Admitting: Emergency Medicine

## 2023-07-02 DIAGNOSIS — O210 Mild hyperemesis gravidarum: Secondary | ICD-10-CM | POA: Insufficient documentation

## 2023-07-02 DIAGNOSIS — Z3A01 Less than 8 weeks gestation of pregnancy: Secondary | ICD-10-CM | POA: Diagnosis not present

## 2023-07-02 LAB — COMPREHENSIVE METABOLIC PANEL
ALT: 13 U/L (ref 0–44)
AST: 16 U/L (ref 15–41)
Albumin: 4.2 g/dL (ref 3.5–5.0)
Alkaline Phosphatase: 41 U/L (ref 38–126)
Anion gap: 10 (ref 5–15)
BUN: 9 mg/dL (ref 6–20)
CO2: 21 mmol/L — ABNORMAL LOW (ref 22–32)
Calcium: 8.8 mg/dL — ABNORMAL LOW (ref 8.9–10.3)
Chloride: 102 mmol/L (ref 98–111)
Creatinine, Ser: 0.57 mg/dL (ref 0.44–1.00)
GFR, Estimated: >60 mL/min (ref >60)
Glucose, Bld: 115 mg/dL — ABNORMAL HIGH (ref 70–99)
Potassium: 3.1 mmol/L — ABNORMAL LOW (ref 3.5–5.1)
Sodium: 133 mmol/L — ABNORMAL LOW (ref 135–145)
Total Bilirubin: 1.1 mg/dL (ref <1.2)
Total Protein: 7.5 g/dL (ref 6.5–8.1)

## 2023-07-02 LAB — CBC WITH DIFFERENTIAL/PLATELET
Abs Immature Granulocytes: 0.08 10*3/uL — ABNORMAL HIGH (ref 0.00–0.07)
Basophils Absolute: 0.1 10*3/uL (ref 0.0–0.1)
Basophils Relative: 0 %
Eosinophils Absolute: 0 10*3/uL (ref 0.0–0.5)
Eosinophils Relative: 0 %
HCT: 38 % (ref 36.0–46.0)
Hemoglobin: 13.2 g/dL (ref 12.0–15.0)
Immature Granulocytes: 1 %
Lymphocytes Relative: 14 %
Lymphs Abs: 2.4 10*3/uL (ref 0.7–4.0)
MCH: 31.7 pg (ref 26.0–34.0)
MCHC: 34.7 g/dL (ref 30.0–36.0)
MCV: 91.1 fL (ref 80.0–100.0)
Monocytes Absolute: 0.9 10*3/uL (ref 0.1–1.0)
Monocytes Relative: 6 %
Neutro Abs: 13.1 10*3/uL — ABNORMAL HIGH (ref 1.7–7.7)
Neutrophils Relative %: 79 %
Platelets: 251 10*3/uL (ref 150–400)
RBC: 4.17 MIL/uL (ref 3.87–5.11)
RDW: 11.8 % (ref 11.5–15.5)
WBC: 16.5 10*3/uL — ABNORMAL HIGH (ref 4.0–10.5)
nRBC: 0 % (ref 0.0–0.2)

## 2023-07-02 LAB — HCG, QUANTITATIVE, PREGNANCY: hCG, Beta Chain, Quant, S: 79523 m[IU]/mL — ABNORMAL HIGH (ref <5)

## 2023-07-02 LAB — POC URINE PREG, ED: Preg Test, Ur: POSITIVE — AB

## 2023-07-02 MED ORDER — SODIUM CHLORIDE 0.9 % IV BOLUS
1000.0000 mL | Freq: Once | INTRAVENOUS | Status: AC
  Administered 2023-07-02: 1000 mL via INTRAVENOUS

## 2023-07-02 MED ORDER — ONDANSETRON 4 MG PO TBDP: 4.0000 mg | ORAL_TABLET | Freq: Three times a day (TID) | ORAL | 0 refills | Status: DC | PRN

## 2023-07-02 MED ORDER — ONDANSETRON 4 MG PO TBDP
4.0000 mg | ORAL_TABLET | Freq: Once | ORAL | Status: AC
  Administered 2023-07-02: 4 mg via ORAL
  Filled 2023-07-02: qty 1

## 2023-07-02 MED ORDER — METOCLOPRAMIDE HCL 5 MG/ML IJ SOLN
10.0000 mg | Freq: Once | INTRAMUSCULAR | Status: AC
  Administered 2023-07-02: 10 mg via INTRAVENOUS
  Filled 2023-07-02: qty 2

## 2023-07-02 NOTE — Discharge Instructions (Addendum)
Take the prescription anti-nausea as needed. Follow-up with your OB provider for further evaluation and management of this pregnancy.

## 2023-07-02 NOTE — ED Triage Notes (Signed)
Pt reports took a pregnancy test at home on Wednesday and ti was positive. Pt reports has been nonstop vomiting since. Pt reports this is her 3rd pregnancy and she did not have this issue with the first 2. Pt denies pain or vaginal bleeding. Pt reports just cannot keep anything down. Has not made a prenatal appt yet.

## 2023-07-02 NOTE — ED Provider Notes (Signed)
[facility] Emergency Department Provider Note     Event Date/Time   First MD Initiated Contact with Patient 07/02/23 1641     (approximate)   History   Possible Pregnancy and Emesis During Pregnancy   HPI  Colleen Steele is a 22 y.o. female G3P2, LMP 10/15, and estimated gestational age of [redacted] weeks and 5 days, presents to the ED after positive home pregnancy test on Wednesday.  Patient would endorse intractable nausea and vomiting since that time.  Patient endorses this is her third pregnancy, but she never experienced hyperemesis with her prior pregnancies.  She is also present with her significant other, and voices some concern that she may not see this pregnancy through to the [facility], considering termination at this time.  Patient otherwise denies any pregnancy related complaints, no crampy abdominal pain, vaginal bleeding, or abnormal vaginal discharge or dysuria.  Physical Exam   Triage Vital Signs: ED Triage Vitals  Encounter Vitals Group     BP 07/02/23 1534 (!) 146/86     Systolic BP Percentile --      Diastolic BP Percentile --      Pulse Rate 07/02/23 1534 62     Resp --      Temp 07/02/23 1534 98.4 F (36.9 C)     Temp src --      SpO2 07/02/23 1534 100 %     Weight 07/02/23 1533 171 lb 15.3 oz (78 kg)     Height 07/02/23 1533 5\' 2"  (1.575 m)     Head Circumference --      Peak Flow --      Pain Score 07/02/23 1532 0     Pain Loc --      Pain Education --      Exclude from Growth Chart --     Most recent vital signs: Vitals:   07/02/23 1534  BP: (!) 146/86  Pulse: 62  Temp: 98.4 F (36.9 C)  SpO2: 100%    General Awake, no distress. NAD HEENT NCAT. PERRL. EOMI. No rhinorrhea. Mucous membranes are moist.  CV:  Good peripheral perfusion. RRR RESP:  Normal effort. CTA ABD:  No distention.    ED Results / Procedures / Treatments   Labs (all labs ordered are listed, but only abnormal results are displayed) Labs Reviewed   CBC WITH DIFFERENTIAL/PLATELET - Abnormal; Notable for the following components:      Result Value   WBC 16.5 (*)    Neutro Abs 13.1 (*)    Abs Immature Granulocytes 0.08 (*)    All other components within normal limits  COMPREHENSIVE METABOLIC PANEL - Abnormal; Notable for the following components:   Sodium 133 (*)    Potassium 3.1 (*)    CO2 21 (*)    Glucose, Bld 115 (*)    Calcium 8.8 (*)    All other components within normal limits  POC URINE PREG, ED - Abnormal; Notable for the following components:   Preg Test, Ur POSITIVE (*)    All other components within normal limits  HCG, QUANTITATIVE, PREGNANCY     EKG   RADIOLOGY  No results found.   PROCEDURES:  Critical Care performed: No  Procedures   MEDICATIONS ORDERED IN ED: Medications  ondansetron (ZOFRAN-ODT) disintegrating tablet 4 mg (has no administration in time range)  sodium chloride 0.9 % bolus 1,000 mL (has no administration in time range)     IMPRESSION / MDM / ASSESSMENT AND PLAN /  ED COURSE  I reviewed the triage vital signs and the nursing notes.                              Differential diagnosis includes, but is not limited to urinary tract infection/pyelonephritis, bowel obstruction, colitis, renal colic, gastroenteritis, hernia, fibroids, endometriosis, pregnancy related pain including ectopic pregnancy, hyperemesis gravidarum, etc.   Patient's presentation is most consistent with acute complicated illness / injury requiring diagnostic workup.  Patient's diagnosis is consistent with hyperemesis gravidarum.  Based on her LMP she is estimated to be 7 weeks and 5 days.  Patient with reassuring workup at this time including a UA without evidence of bacteriuria and a beta quant that is greater than 70,000.  Patient has declined ultrasound imaging at this particular visit, and again denies any pregnancy related complaints.  She is also contemplating termination so we will follow-up with OB as  planned.  Patient will be discharged home with prescriptions for Zofran. Patient is to follow up with [facility] as discussed, as needed or otherwise directed. Patient is given ED precautions to return to the ED for any worsening or new symptoms.  Clinical Course as of 07/02/23 2245  Sun Jul 02, 2023  1830 Sodium(!): 133 [JM]  2040 HCG, Beta Francene Finders(!): 16,109 [JM]    Clinical Course User Index [JM] Elston Aldape, Charlesetta Ivory, PA-C    FINAL CLINICAL IMPRESSION(S) / ED DIAGNOSES   Final diagnoses:  Less than [redacted] weeks gestation of pregnancy  Hyperemesis gravidarum     Rx / DC Orders   ED Discharge Orders     None        Note:  This document was prepared using Dragon voice recognition software and may include unintentional dictation errors.    Lissa Hoard, PA-C 07/02/23 2256    Phineas Semen, MD 07/02/23 2258

## 2023-07-26 NOTE — L&D Delivery Note (Addendum)
 Delivery Note  Colleen Steele is a [id number] at [redacted]w[redacted]d, Patient's last menstrual period was 05/09/2023 (approximate)., not consistent with US  at [redacted]w[redacted]d. Estimated Date of Delivery: 02/15/24   First Stage: Labor onset: 02/08/2024 at 2000 Augmentation: oxytocin  Analgesia Holli intrapartum: None SROM at 1100 GBS: positive IP Antibiotics: penicillin  x 3  Second Stage: Complete dilation at 0012 Onset of pushing at 0012 FHR second stage 130 bpm with moderate variability, accels present   [name] presented to L&D with SROM. She was augmented with oxytocin  d/t GBS positive status. She progressed to C/C/+2 with a strong urge to push.  She pushed effectively over approximately 5 minutes for a spontaneous vaginal birth.  Delivery of a viable baby boy on 02/09/2024 at 0017 by CNM Delivery of fetal head in OA position with restitution to ROT. No nuchal cord;  Anterior then posterior shoulders delivered easily with gentle downward traction. Baby placed on near maternal thigh d/t short umbilical cord, and attended to by baby RN. Cord double clamped after cessation of pulsation, cut by father of baby.  Cord blood sample collection: Yes O POS  Third Stage: Oxytocin  bolus started after delivery of infant for hemorrhage prophylaxis  Placenta delivered via Keren mechanism intact with 3 VC @ 0022 Placenta disposition: discarded  Uterine tone firm / bleeding small  Laceration: none Anesthesia for repair: N/A Suture: N/A Est. Blood Loss (mL): 200 ml   Complications: None  Mom to postpartum.  Baby to Couplet care / Skin to Skin.  Newborn: Information for the patient's newborn:  Johnnae, Impastato [[id number]]  Live born female  Birth Weight:  pending  APGAR: 8, 9  Newborn Delivery   Birth date/time: 02/09/2024 00:17:00 Delivery type:       Feeding planned: breast feeding  ---------- Therisa Pillow, CNM Certified Nurse Midwife [facility]

## 2023-08-02 ENCOUNTER — Emergency Department: Payer: Medicaid Other

## 2023-08-02 ENCOUNTER — Emergency Department
Admission: EM | Admit: 2023-08-02 | Discharge: 2023-08-03 | Disposition: A | Discharge status: Home / Self Care | Payer: Medicaid Other | Attending: Emergency Medicine | Admitting: Emergency Medicine

## 2023-08-02 ENCOUNTER — Other Ambulatory Visit: Payer: Self-pay

## 2023-08-02 ENCOUNTER — Encounter: Payer: Self-pay | Admitting: Emergency Medicine

## 2023-08-02 DIAGNOSIS — O2311 Infections of bladder in pregnancy, first trimester: Secondary | ICD-10-CM | POA: Diagnosis not present

## 2023-08-02 DIAGNOSIS — Z3A12 12 weeks gestation of pregnancy: Secondary | ICD-10-CM | POA: Insufficient documentation

## 2023-08-02 DIAGNOSIS — O99281 Endocrine, nutritional and metabolic diseases complicating pregnancy, first trimester: Secondary | ICD-10-CM | POA: Insufficient documentation

## 2023-08-02 DIAGNOSIS — E876 Hypokalemia: Secondary | ICD-10-CM | POA: Insufficient documentation

## 2023-08-02 DIAGNOSIS — O210 Mild hyperemesis gravidarum: Secondary | ICD-10-CM | POA: Insufficient documentation

## 2023-08-02 DIAGNOSIS — K8020 Calculus of gallbladder without cholecystitis without obstruction: Secondary | ICD-10-CM

## 2023-08-02 DIAGNOSIS — N3000 Acute cystitis without hematuria: Secondary | ICD-10-CM

## 2023-08-02 LAB — BASIC METABOLIC PANEL
Anion gap: 18 — ABNORMAL HIGH (ref 5–15)
BUN: 7 mg/dL (ref 6–20)
CO2: 21 mmol/L — ABNORMAL LOW (ref 22–32)
Calcium: 10.1 mg/dL (ref 8.9–10.3)
Chloride: 99 mmol/L (ref 98–111)
Creatinine, Ser: 0.73 mg/dL (ref 0.44–1.00)
GFR, Estimated: >60 mL/min (ref >60)
Glucose, Bld: 111 mg/dL — ABNORMAL HIGH (ref 70–99)
Potassium: 2.9 mmol/L — ABNORMAL LOW (ref 3.5–5.1)
Sodium: 138 mmol/L (ref 135–145)

## 2023-08-02 LAB — URINALYSIS, ROUTINE W REFLEX MICROSCOPIC
Bilirubin Urine: NEGATIVE
Glucose, UA: NEGATIVE mg/dL
Hgb urine dipstick: NEGATIVE
Ketones, ur: 20 mg/dL — AB
Nitrite: NEGATIVE
Protein, ur: 100 mg/dL — AB
Specific Gravity, Urine: 1.024 (ref 1.005–1.030)
WBC, UA: >50 WBC/hpf (ref 0–5)
pH: 7 (ref 5.0–8.0)

## 2023-08-02 LAB — HEPATIC FUNCTION PANEL
ALT: 61 U/L — ABNORMAL HIGH (ref 0–44)
AST: 48 U/L — ABNORMAL HIGH (ref 15–41)
Albumin: 4.1 g/dL (ref 3.5–5.0)
Alkaline Phosphatase: 38 U/L (ref 38–126)
Bilirubin, Direct: 0.4 mg/dL — ABNORMAL HIGH (ref 0.0–0.2)
Indirect Bilirubin: 1.2 mg/dL — ABNORMAL HIGH (ref 0.3–0.9)
Total Bilirubin: 1.6 mg/dL — ABNORMAL HIGH (ref 0.0–1.2)
Total Protein: 7.4 g/dL (ref 6.5–8.1)

## 2023-08-02 LAB — CBC
HCT: 43.5 % (ref 36.0–46.0)
Hemoglobin: 15.8 g/dL — ABNORMAL HIGH (ref 12.0–15.0)
MCH: 31.3 pg (ref 26.0–34.0)
MCHC: 36.3 g/dL — ABNORMAL HIGH (ref 30.0–36.0)
MCV: 86.1 fL (ref 80.0–100.0)
Platelets: 328 10*3/uL (ref 150–400)
RBC: 5.05 MIL/uL (ref 3.87–5.11)
RDW: 11.8 % (ref 11.5–15.5)
WBC: 18 10*3/uL — ABNORMAL HIGH (ref 4.0–10.5)
nRBC: 0 % (ref 0.0–0.2)

## 2023-08-02 LAB — LIPASE, BLOOD: Lipase: 19 U/L (ref 11–51)

## 2023-08-02 LAB — TROPONIN I (HIGH SENSITIVITY)
Troponin I (High Sensitivity): 3 ng/L (ref <18)
Troponin I (High Sensitivity): 4 ng/L (ref <18)

## 2023-08-02 LAB — MAGNESIUM: Magnesium: 2 mg/dL (ref 1.7–2.4)

## 2023-08-02 LAB — HCG, QUANTITATIVE, PREGNANCY: hCG, Beta Chain, Quant, S: 119540 m[IU]/mL — ABNORMAL HIGH (ref <5)

## 2023-08-02 MED ORDER — METOCLOPRAMIDE HCL 10 MG PO TABS: 10.0000 mg | ORAL_TABLET | Freq: Three times a day (TID) | ORAL | 0 refills | Status: DC

## 2023-08-02 MED ORDER — POTASSIUM CHLORIDE 10 MEQ/100ML IV SOLN
10.0000 meq | INTRAVENOUS | Status: AC
  Administered 2023-08-02 (×2): 10 meq via INTRAVENOUS
  Filled 2023-08-02: qty 100

## 2023-08-02 MED ORDER — POTASSIUM CHLORIDE CRYS ER 20 MEQ PO TBCR
40.0000 meq | EXTENDED_RELEASE_TABLET | Freq: Once | ORAL | Status: AC
  Administered 2023-08-02: 40 meq via ORAL
  Filled 2023-08-02: qty 2

## 2023-08-02 MED ORDER — METOCLOPRAMIDE HCL 5 MG/ML IJ SOLN
10.0000 mg | Freq: Once | INTRAMUSCULAR | Status: AC
  Administered 2023-08-02: 10 mg via INTRAVENOUS
  Filled 2023-08-02: qty 2

## 2023-08-02 MED ORDER — DEXTROSE 5 % AND 0.9 % NACL IV BOLUS
1000.0000 mL | Freq: Once | INTRAVENOUS | Status: DC
  Filled 2023-08-02: qty 1000

## 2023-08-02 MED ORDER — SODIUM CHLORIDE 0.9 % IV BOLUS
1000.0000 mL | Freq: Once | INTRAVENOUS | Status: AC
  Administered 2023-08-02: 1000 mL via INTRAVENOUS

## 2023-08-02 MED ORDER — CEPHALEXIN 500 MG PO CAPS: 500.0000 mg | ORAL_CAPSULE | Freq: Four times a day (QID) | ORAL | 0 refills | Status: AC

## 2023-08-02 MED ORDER — POTASSIUM CHLORIDE 20 MEQ PO PACK
40.0000 meq | PACK | Freq: Every day | ORAL | Status: DC
  Administered 2023-08-02: 40 meq via ORAL
  Filled 2023-08-02: qty 2

## 2023-08-02 NOTE — ED Notes (Signed)
 See triage notes. Patient is approximately [redacted] weeks pregnant. Patient c/o N/V and chest pain secondary to the vomiting for the past several weeks.

## 2023-08-02 NOTE — ED Triage Notes (Addendum)
 Pt arrived via POV with reports central chest pain and vomiting for several weeks. Pt states the chest pain worse when vomiting.   Pt is also pregnant  - approx 12 weeks.   Pt has no nausea meds at home, unable to get appt until 1/14 with OB. Goes to [facility]

## 2023-08-02 NOTE — ED Provider Notes (Signed)
-----------------------------------------   4:52 PM on 08/02/2023 ----------------------------------------- Patient care assumed from prior provider.  Patient's ultrasound has resulted showing some mild ductal dilation given the patient's slight total bilirubin elevation we will proceed with an MRCP to rule out choledocholithiasis.  The remainder of the patient's workup does show what appears consistent with urinary tract infection we will place the patient on Macrobid and send a urine culture.  No other significant findings sides mild leukocytosis.   Dorothyann Drivers, MD 08/07/23 2310

## 2023-08-02 NOTE — ED Provider Notes (Signed)
 [facility] Provider Note    Event Date/Time   First MD Initiated Contact with Patient 08/02/23 1048     (approximate)   History   Emesis and Chest Pain   HPI  Colleen Steele is a 23 y.o. female who is a proximately [redacted] weeks pregnant who comes in with nausea vomiting in the setting of pregnancy.  Patient has not had ultrasound yet to confirm pregnancy.  She does not have a appointment for over a week.  She reports some suprapubic discomfort no vaginal bleeding.  She reports that she has been vomiting multiple times.  She states that she develops chest pain when vomiting but denies any chest pain at this time.  She reports difficulty taking p.o.  She reports taking Zofran  at home but having run out.  Initially it seemed to help a little bit but no significant relief.     Physical Exam   Triage Vital Signs: ED Triage Vitals  Encounter Vitals Group     BP 08/02/23 0940 (!) 127/96     Systolic BP Percentile --      Diastolic BP Percentile --      Pulse Rate 08/02/23 0940 (!) 103     Resp 08/02/23 0940 (!) 22     Temp 08/02/23 0940 97.7 F (36.5 C)     Temp Source 08/02/23 0940 Oral     SpO2 08/02/23 0940 100 %     Weight 08/02/23 0941 156 lb (70.8 kg)     Height 08/02/23 0941 5' 2 (1.575 m)     Head Circumference --      Peak Flow --      Pain Score 08/02/23 0942 9     Pain Loc --      Pain Education --      Exclude from Growth Chart --     Most recent vital signs: Vitals:   08/02/23 0940  BP: (!) 127/96  Pulse: (!) 103  Resp: (!) 22  Temp: 97.7 F (36.5 C)  SpO2: 100%     General: Awake, no distress.  CV:  Good peripheral perfusion.  Resp:  Normal effort.  Abd:  No distention.  Mild suprapubic tenderness without any rebound or guarding Other:  No chest wall tenderness no crepitus   ED Results / Procedures / Treatments   Labs (all labs ordered are listed, but only abnormal results are displayed) Labs Reviewed  BASIC  METABOLIC PANEL - Abnormal; Notable for the following components:      Result Value   Potassium 2.9 (*)    CO2 21 (*)    Glucose, Bld 111 (*)    Anion gap 18 (*)    All other components within normal limits  CBC - Abnormal; Notable for the following components:   WBC 18.0 (*)    Hemoglobin 15.8 (*)    MCHC 36.3 (*)    All other components within normal limits  URINALYSIS, ROUTINE W REFLEX MICROSCOPIC  MAGNESIUM   HEPATIC FUNCTION PANEL  LIPASE, BLOOD  TROPONIN I (HIGH SENSITIVITY)     EKG  My interpretation of EKG:  Sinus tachycardia rate of 105 without any ST elevations, T wave inversions, normal intervals  RADIOLOGY I have reviewed the xray personally and interpreted and no pneumonia PROCEDURES:  Critical Care performed: No  Procedures   MEDICATIONS ORDERED IN ED: Medications  potassium chloride  10 mEq in 100 mL IVPB (has no administration in time range)  sodium chloride  0.9 % bolus 1,000  mL (has no administration in time range)  metoCLOPramide  (REGLAN ) injection 10 mg (has no administration in time range)     IMPRESSION / MDM / ASSESSMENT AND PLAN / ED COURSE  I reviewed the triage vital signs and the nursing notes.   Patient's presentation is most consistent with acute presentation with potential threat to life or bodily function.   Patient comes in with concerns for hyperemesis gravidarum.  She has not had a ultrasound to confirm pregnancy location and she does report some suprapubic pain will get ultrasound to rule out ectopic pregnancy or other complications.  Labs ordered evaluate for Electra abnormalities, AKI.  Patient be given some fluids, potassium.  Patient reports Zofran  does not seem to be helping.  She is willing to try the Reglan .  Will trial this first.  Cardiac markers negative x 2 magnesium  is normal.  Hepatic function do show slight elevation of LFTs with normal lipase.  We discussed that sometimes this can be related to dehydration but symptoms  that can be related to issues with her gallbladder.  I reevaluated her abdomen she denies any right upper quadrant tenderness.  We discussed ultrasound to evaluate for gallstone but patient declined saying that she is not had any upper abdominal discomfort.  Patient attempted to try to take the potassium down but vomited it back up.  Will trial the packets.  Patient reports unable to tolerate the potassium.  The IV medication will got her up to 3.1 and she declines doing any p.o. medication at this time.  Her EKG without any changes.  She now states that she wants to do the ultrasound to evaluate for gallstones.  Patient will be handed off pending this and if negative patient will be discharged home  The patient is on the cardiac monitor to evaluate for evidence of arrhythmia and/or significant heart rate changes.      FINAL CLINICAL IMPRESSION(S) / ED DIAGNOSES   Final diagnoses:  Hyperemesis arising during pregnancy  Acute cystitis without hematuria  Hypokalemia     Rx / DC Orders   ED Discharge Orders     None        Note:  This document was prepared using Dragon voice recognition software and may include unintentional dictation errors.   Ernest Ronal BRAVO, MD 08/02/23 [phone]

## 2023-08-02 NOTE — Discharge Instructions (Addendum)
 Return to the ER if you develop worsening symptoms or any other concerns otherwise we are covering you for UTI and giving you some medications to help with pain, nausea, vomiting during pregnancy.

## 2023-08-02 NOTE — ED Notes (Signed)
 Pt was asking about something to eat--she has fluids at bedside.  Gave pt crackers to see if she can tolerate.

## 2023-08-03 MED ORDER — OXYCODONE-ACETAMINOPHEN 5-325 MG PO TABS: 1.0000 | ORAL_TABLET | ORAL | 0 refills | Status: DC | PRN

## 2023-08-03 MED ORDER — SODIUM CHLORIDE 0.9 % IV SOLN
1.0000 g | Freq: Once | INTRAVENOUS | Status: AC
  Administered 2023-08-03: 1 g via INTRAVENOUS
  Filled 2023-08-03: qty 10

## 2023-08-03 MED ORDER — ONDANSETRON HCL 4 MG/2ML IJ SOLN
4.0000 mg | Freq: Once | INTRAMUSCULAR | Status: AC
  Administered 2023-08-03: 4 mg via INTRAVENOUS
  Filled 2023-08-03: qty 2

## 2023-08-03 NOTE — ED Provider Notes (Signed)
-----------------------------------------   12:01 AM on 08/03/2023 -----------------------------------------   Care assumed of patient who is awaiting results of her MRCP.  Have called [facility] radiology to inquire about delay.  UTI noted; will administer 1 g IV Rocephin .  ----------------------------------------- 12:47 AM on 08/03/2023 -----------------------------------------   Radiologist called to say MRCP was nondiagnostic due to movement.  Patient recall she was nauseous when she was in the MRI.  Apologized and discussed with patient's importance of obtaining MRCP as it affects her disposition.  Currently denies pain.  Agrees to repeat MRCP with administering IV Zofran  before hand.  Of note, patient's tray table was littered with graham crackers, peanut butter and drinks.  Encourage patient to be NPO while receiving evaluation.  ----------------------------------------- 2:53 AM on 08/03/2023 -----------------------------------------   Patient currently in MRI.  Unfortunately additional delay occurred because patient ate graham crackers and peanut butter in addition to drinking fluids so it was 4 hours from time of meal before repeat MRCP could be done.  ----------------------------------------- 3:30 AM on 08/03/2023 -----------------------------------------   MRCP wet read per Dr. Beatriz: Cholelithiasis without acute cholecystitis; no biliary or pancreatic ductal dilatation, no pancreatitis.  Patient denies pain.  Will discharge home with as needed pain medicine.  Prescription for Reglan  and Keflex  already sent to the pharmacy by previous provider.  Strict return precautions given.  Patient verbalizes understanding and agrees with plan of care.   Colleen Steele J, MD 08/03/23 [phone]

## 2023-08-04 LAB — URINE CULTURE: Culture: >=100000 — AB

## 2023-08-06 NOTE — Progress Notes (Signed)
 ED Antimicrobial Stewardship Positive Culture Follow Up   Colleen Steele is an 23 y.o. female who presented to [facility] on 08/02/2023 with a chief complaint of nausea. Can to the ED ~12 - 14 months pregnant.   Chief Complaint  Patient presents with   Emesis   Chest Pain   Recent Results (from the past 720 hours)  Urine Culture     Status: Abnormal   Collection Time: 08/02/23  9:44 AM   Specimen: Urine, Clean Catch  Result Value Ref Range Status   Specimen Description   Final    URINE, CLEAN CATCH Performed at [facility], [address] [place], [place] [address]    Special Requests   Final    NONE Performed at [facility], [address] [place], [place] [address]    Culture >=100,000 COLONIES/mL STAPHYLOCOCCUS SAPROPHYTICUS (A)  Final   Report Status 08/04/2023 FINAL  Final   Organism ID, Bacteria STAPHYLOCOCCUS SAPROPHYTICUS (A)  Final      Susceptibility   Staphylococcus saprophyticus - MIC*    CIPROFLOXACIN <=0.5 SENSITIVE Sensitive     GENTAMICIN  <=0.5 SENSITIVE Sensitive     NITROFURANTOIN <=16 SENSITIVE Sensitive     OXACILLIN 1 RESISTANT Resistant     TETRACYCLINE <=1 SENSITIVE Sensitive     VANCOMYCIN 1 SENSITIVE Sensitive     TRIMETH/SULFA <=10 SENSITIVE Sensitive     RIFAMPIN <=0.5 SENSITIVE Sensitive     Inducible Clindamycin  NEGATIVE Sensitive     * >=100,000 COLONIES/mL STAPHYLOCOCCUS SAPROPHYTICUS    [x]  Treated with Cephalexin , organism resistant to prescribed antimicrobial []  Patient discharged originally without antimicrobial agent and treatment is now indicated  New antibiotic prescription:  Nitrofurantoin 100 mg BID x 5 days  ED Provider: Ester Sharps, MD  Alfonso MARLA Buys, PharmD Pharmacy Resident  08/06/2023 2:30 PM

## 2023-08-14 DIAGNOSIS — O0993 Supervision of high risk pregnancy, unspecified, third trimester: Secondary | ICD-10-CM | POA: Insufficient documentation

## 2023-10-16 LAB — OB RESULTS CONSOLE VARICELLA ZOSTER ANTIBODY, IGG: Varicella: IMMUNE

## 2023-10-16 LAB — OB RESULTS CONSOLE RUBELLA ANTIBODY, IGM: Rubella: IMMUNE

## 2023-10-16 LAB — OB RESULTS CONSOLE HEPATITIS B SURFACE ANTIGEN: Hepatitis B Surface Ag: NEGATIVE

## 2023-10-27 ENCOUNTER — Other Ambulatory Visit

## 2023-10-30 ENCOUNTER — Other Ambulatory Visit

## 2023-10-30 ENCOUNTER — Encounter: Admitting: Nurse Practitioner

## 2023-10-30 NOTE — Progress Notes (Signed)
 Patient not seen by provider. Per RN wanted employment physical and TB testing, which is not offered at this clinic. RN advised patient to contact OB provider (patient is currently pregnant) and ask for copies of initial OB performed 2 weeks ago to provide to employer for physical documentation.  Colleen Lund L. Delynn Olvera, FNP-C

## 2023-10-31 ENCOUNTER — Other Ambulatory Visit

## 2023-11-01 ENCOUNTER — Other Ambulatory Visit

## 2023-11-03 ENCOUNTER — Other Ambulatory Visit

## 2023-11-06 ENCOUNTER — Other Ambulatory Visit

## 2023-11-28 LAB — OB RESULTS CONSOLE HIV ANTIBODY (ROUTINE TESTING): HIV: NONREACTIVE

## 2024-01-05 ENCOUNTER — Emergency Department

## 2024-01-05 ENCOUNTER — Observation Stay
Admission: EM | Admit: 2024-01-05 | Discharge: 2024-01-05 | Disposition: A | Discharge status: Home / Self Care | Attending: Obstetrics | Admitting: Obstetrics

## 2024-01-05 ENCOUNTER — Other Ambulatory Visit: Payer: Self-pay

## 2024-01-05 DIAGNOSIS — Z3A34 34 weeks gestation of pregnancy: Secondary | ICD-10-CM | POA: Diagnosis not present

## 2024-01-05 DIAGNOSIS — R0602 Shortness of breath: Secondary | ICD-10-CM

## 2024-01-05 DIAGNOSIS — O368130 Decreased fetal movements, third trimester, not applicable or unspecified: Principal | ICD-10-CM | POA: Insufficient documentation

## 2024-01-05 DIAGNOSIS — O368190 Decreased fetal movements, unspecified trimester, not applicable or unspecified: Secondary | ICD-10-CM | POA: Diagnosis present

## 2024-01-05 DIAGNOSIS — O10013 Pre-existing essential hypertension complicating pregnancy, third trimester: Secondary | ICD-10-CM | POA: Diagnosis not present

## 2024-01-05 DIAGNOSIS — N390 Urinary tract infection, site not specified: Principal | ICD-10-CM

## 2024-01-05 LAB — URINALYSIS, ROUTINE W REFLEX MICROSCOPIC
Bilirubin Urine: NEGATIVE
Glucose, UA: NEGATIVE mg/dL
Hgb urine dipstick: NEGATIVE
Ketones, ur: NEGATIVE mg/dL
Nitrite: NEGATIVE
Protein, ur: NEGATIVE mg/dL
Specific Gravity, Urine: 1.017 (ref 1.005–1.030)
pH: 6 (ref 5.0–8.0)

## 2024-01-05 LAB — PROTEIN / CREATININE RATIO, URINE
Creatinine, Urine: 131 mg/dL
Protein Creatinine Ratio: 0.07 mg/mg{creat} (ref 0.00–0.15)
Total Protein, Urine: 9 mg/dL

## 2024-01-05 LAB — HEPATIC FUNCTION PANEL
ALT: 19 U/L (ref 0–44)
AST: 25 U/L (ref 15–41)
Albumin: 3.2 g/dL — ABNORMAL LOW (ref 3.5–5.0)
Alkaline Phosphatase: 106 U/L (ref 38–126)
Bilirubin, Direct: 0.1 mg/dL (ref 0.0–0.2)
Indirect Bilirubin: 1.3 mg/dL — ABNORMAL HIGH (ref 0.3–0.9)
Total Bilirubin: 1.4 mg/dL — ABNORMAL HIGH (ref 0.0–1.2)
Total Protein: 6.5 g/dL (ref 6.5–8.1)

## 2024-01-05 LAB — BASIC METABOLIC PANEL WITH GFR
Anion gap: 10 (ref 5–15)
BUN: 5 mg/dL — ABNORMAL LOW (ref 6–20)
CO2: 20 mmol/L — ABNORMAL LOW (ref 22–32)
Calcium: 8.4 mg/dL — ABNORMAL LOW (ref 8.9–10.3)
Chloride: 105 mmol/L (ref 98–111)
Creatinine, Ser: 0.56 mg/dL (ref 0.44–1.00)
GFR, Estimated: >60 mL/min (ref >60)
Glucose, Bld: 97 mg/dL (ref 70–99)
Potassium: 3.4 mmol/L — ABNORMAL LOW (ref 3.5–5.1)
Sodium: 135 mmol/L (ref 135–145)

## 2024-01-05 LAB — CBC
HCT: 31.9 % — ABNORMAL LOW (ref 36.0–46.0)
Hemoglobin: 11 g/dL — ABNORMAL LOW (ref 12.0–15.0)
MCH: 31.9 pg (ref 26.0–34.0)
MCHC: 34.5 g/dL (ref 30.0–36.0)
MCV: 92.5 fL (ref 80.0–100.0)
Platelets: 202 10*3/uL (ref 150–400)
RBC: 3.45 MIL/uL — ABNORMAL LOW (ref 3.87–5.11)
RDW: 12.7 % (ref 11.5–15.5)
WBC: 9.3 10*3/uL (ref 4.0–10.5)
nRBC: 0 % (ref 0.0–0.2)

## 2024-01-05 LAB — D-DIMER, QUANTITATIVE: D-Dimer, Quant: 1.6 ug{FEU}/mL — ABNORMAL HIGH (ref 0.00–0.50)

## 2024-01-05 LAB — TROPONIN I (HIGH SENSITIVITY)
Troponin I (High Sensitivity): 6 ng/L (ref <18)
Troponin I (High Sensitivity): 6 ng/L (ref <18)

## 2024-01-05 LAB — BRAIN NATRIURETIC PEPTIDE: B Natriuretic Peptide: 23.1 pg/mL (ref 0.0–100.0)

## 2024-01-05 MED ORDER — IPRATROPIUM-ALBUTEROL 0.5-2.5 (3) MG/3ML IN SOLN
3.0000 mL | Freq: Once | RESPIRATORY_TRACT | Status: AC
  Filled 2024-01-05: qty 3

## 2024-01-05 MED ORDER — CEPHALEXIN 500 MG PO CAPS: 500.0000 mg | ORAL_CAPSULE | Freq: Four times a day (QID) | ORAL | 0 refills | Status: AC

## 2024-01-05 MED ORDER — IOHEXOL 350 MG/ML SOLN
75.0000 mL | Freq: Once | INTRAVENOUS | Status: AC | PRN
  Administered 2024-01-05: 75 mL via INTRAVENOUS

## 2024-01-05 NOTE — OB Triage Note (Signed)
 Pt seen in ED for SOB and leg pain. Sent to L&D for NST

## 2024-01-05 NOTE — ED Notes (Signed)
 This RN gave report to L/D

## 2024-01-05 NOTE — Discharge Summary (Signed)
 Progress Note Decreased Fetal Movement.  Subjective:   Katielynn Pina is a 23 y.o. female. She is at [redacted]w[redacted]d gestation. She has noted decreased fetal movement intermittently and today, but denies bleeding, contractions, cramping or leaking. She was seen in the ED for SOB, chest and leg pain. She was cleared by the ED and sent to labor and delivery triage for evaluation. Her pregnancy has been complicated by:  Patient Active Problem List   Diagnosis Date Noted   Chronic hypertension in pregnancy 10/20/2021   Encounter for induction of labor 10/20/2021   Non-reactive NST (non-stress test) 10/13/2021   Shortness of breath 09/29/2021   Decreased fetal movement 08/08/2021   Migraine headache 05/22/2018   Trichomonal infection 04/20/2018   Gonorrhea 04/20/2018   Chlamydia 04/20/2018   Supervision of high risk pregnancy in third trimester 03/01/2018    PMH, PSH, POBH and problem list reviewed Medications and allergies reviewed.    Objective:    BP Readings from Last 3 Encounters:  01/05/24 121/76  10/30/23 121/77  08/03/23 122/79     General appearance: alert, well appearing, and in no distress. External fetal monitoring:  NST reactive Results for orders placed or performed during the hospital encounter of 01/05/24 (from the past 48 hours)  Basic metabolic panel   Collection Time: 01/05/24  9:33 AM  Result Value Ref Range   Sodium 135 135 - 145 mmol/L   Potassium 3.4 (L) 3.5 - 5.1 mmol/L   Chloride 105 98 - 111 mmol/L   CO2 20 (L) 22 - 32 mmol/L   Glucose, Bld 97 70 - 99 mg/dL   BUN 5 (L) 6 - 20 mg/dL   Creatinine, Ser 6.96 0.44 - 1.00 mg/dL   Calcium  8.4 (L) 8.9 - 10.3 mg/dL   GFR, Estimated >29 >52 mL/min   Anion gap 10 5 - 15  CBC   Collection Time: 01/05/24  9:33 AM  Result Value Ref Range   WBC 9.3 4.0 - 10.5 K/uL   RBC 3.45 (L) 3.87 - 5.11 MIL/uL   Hemoglobin 11.0 (L) 12.0 - 15.0 g/dL   HCT 84.1 (L) 32.4 - 40.1 %   MCV 92.5 80.0 - 100.0 fL   MCH 31.9 26.0 - 34.0  pg   MCHC 34.5 30.0 - 36.0 g/dL   RDW 02.7 25.3 - 66.4 %   Platelets 202 150 - 400 K/uL   nRBC 0.0 0.0 - 0.2 %  D-dimer, quantitative   Collection Time: 01/05/24  9:33 AM  Result Value Ref Range   D-Dimer, Quant 1.60 (H) 0.00 - 0.50 ug/mL-FEU  Brain natriuretic peptide   Collection Time: 01/05/24  9:33 AM  Result Value Ref Range   B Natriuretic Peptide 23.1 0.0 - 100.0 pg/mL  Hepatic function panel   Collection Time: 01/05/24  9:33 AM  Result Value Ref Range   Total Protein 6.5 6.5 - 8.1 g/dL   Albumin 3.2 (L) 3.5 - 5.0 g/dL   AST 25 15 - 41 U/L   ALT 19 0 - 44 U/L   Alkaline Phosphatase 106 38 - 126 U/L   Total Bilirubin 1.4 (H) 0.0 - 1.2 mg/dL   Bilirubin, Direct 0.1 0.0 - 0.2 mg/dL   Indirect Bilirubin 1.3 (H) 0.3 - 0.9 mg/dL  Troponin I (High Sensitivity)   Collection Time: 01/05/24  9:33 AM  Result Value Ref Range   Troponin I (High Sensitivity) 6 <18 ng/L  Troponin I (High Sensitivity)   Collection Time: 01/05/24 12:27 PM  Result Value  Ref Range   Troponin I (High Sensitivity) 6 <18 ng/L  Urinalysis, Routine w reflex microscopic -Urine, Clean Catch   Collection Time: 01/05/24  2:14 PM  Result Value Ref Range   Color, Urine YELLOW (A) YELLOW   APPearance HAZY (A) CLEAR   Specific Gravity, Urine 1.017 1.005 - 1.030   pH 6.0 5.0 - 8.0   Glucose, UA NEGATIVE NEGATIVE mg/dL   Hgb urine dipstick NEGATIVE NEGATIVE   Bilirubin Urine NEGATIVE NEGATIVE   Ketones, ur NEGATIVE NEGATIVE mg/dL   Protein, ur NEGATIVE NEGATIVE mg/dL   Nitrite NEGATIVE NEGATIVE   Leukocytes,Ua MODERATE (A) NEGATIVE   RBC / HPF 6-10 0 - 5 RBC/hpf   WBC, UA 11-20 0 - 5 WBC/hpf   Bacteria, UA RARE (A) NONE SEEN   Squamous Epithelial / HPF 0-5 0 - 5 /HPF   Mucus PRESENT    Amorphous Crystal PRESENT   Protein / creatinine ratio, urine   Collection Time: 01/05/24  2:14 PM  Result Value Ref Range   Creatinine, Urine 131 mg/dL   Total Protein, Urine 9 mg/dL   Protein Creatinine Ratio 0.07 0.00 -  0.15 mg/mg[Cre]     Assessment:   Pregnancy at [redacted]w[redacted]d with concerns for decreased fetal movement. Evaluation reveals:  NST: Baseline FHR: 145 beats/min Variability: moderate Accelerations: present Decelerations: absent Tocometry: quiet  Interpretation:  INDICATIONS: decreased fetal movement RESULTS:  A NST procedure was performed with FHR monitoring and a normal baseline established, appropriate time of 20-40 minutes of evaluation, and accels >2 seen w 15x15 characteristics.  Results show a REACTIVE NST.    Arzella Laurence, CNM  Orders placed: Orders Placed This Encounter  Procedures   1-3 Lead EKG Interpretation    This order was created via procedure documentation    Standing Status:   Standing    Number of Occurrences:   1   Urine Culture    Standing Status:   Standing    Number of Occurrences:   1    Indication:   Dysuria   DG Chest Portable 1 View    Standing Status:   Standing    Number of Occurrences:   1    Reason for Exam (SYMPTOM  OR DIAGNOSIS REQUIRED):   sob    Is patient pregnant?:   Yes   US  Venous Img Lower Bilateral    Standing Status:   Standing    Number of Occurrences:   1    Reason for Exam (SYMPTOM  OR DIAGNOSIS REQUIRED):   leg pain   CT Angio Chest PE W and/or Wo Contrast    Standing Status:   Standing    Number of Occurrences:   1    Does the patient have a contrast media/X-ray dye allergy?:   No    If indicated for the ordered procedure, I authorize the administration of contrast media per Radiology protocol:   Yes    Is patient pregnant?:   Yes    Radiology Contrast Protocol - do NOT remove file path:   [email or web address]   Basic metabolic panel    Standing Status:   Standing    Number of Occurrences:   1   CBC    Standing Status:   Standing    Number of Occurrences:   1   D-dimer, quantitative    Standing Status:   Standing    Number of Occurrences:   1   Brain natriuretic peptide    Standing  Status:   Standing  Number of Occurrences:   1   Hepatic function panel    Standing Status:   Standing    Number of Occurrences:   1   Urinalysis, Routine w reflex microscopic -Urine, Clean Catch    Standing Status:   Standing    Number of Occurrences:   1    Specimen Source:   Urine, Clean Catch [76]   Protein / creatinine ratio, urine    Standing Status:   Standing    Number of Occurrences:   1   Document Height and Actual Weight    Use scales to weigh patient, not stated or estimated weight.    Standing Status:   Standing    Number of Occurrences:   1   Assess fetal heart tones    Standing Status:   Standing    Number of Occurrences:   1   Fetal non-stress test    Standing Status:   Standing    Number of Occurrences:   1   ED EKG    Standing Status:   Standing    Number of Occurrences:   1    Reason for Exam:   Other (See Comments)   EKG 12-Lead    Standing Status:   Standing    Number of Occurrences:   1   Place in observation (patient's expected length of stay will be less than 2 midnights)    Standing Status:   Standing    Number of Occurrences:   1    Hospital Area:   [facility] [[id number]]    Level of Care:   Antepartum [20]    Covid Evaluation:   Asymptomatic - no recent exposure (last 10 days) testing not required    Diagnosis:   Decreased fetal movement [161096]    Admitting Physician:   Carolynn Citrin [[id number]]    Attending Physician:   Antonio Woodhams [phone]    Patient expresses understanding of information provided and plan of care.    Jayanth Szczesniak, CNM 01/05/2024 5:30 PM

## 2024-01-05 NOTE — Discharge Instructions (Signed)
 Cleared to go to L&D for further evaluation.  We have started you on medications to help with possible UTI you follow-up with urine culture results and return to the emergency room for worsening symptoms or any other concerns

## 2024-01-05 NOTE — ED Notes (Signed)
 First nurse note: Pt presents with chic fila in hand, pt encouraged not to eat or drink until seen by provider.

## 2024-01-05 NOTE — OB Triage Note (Signed)
Discharge instructions, labor precautions, and follow-up care reviewed with patient. All questions answered. Patient verbalized understanding. Discharged ambulatory off unit.   

## 2024-01-05 NOTE — ED Triage Notes (Signed)
 Pt comes in via pov with complaints of shortness of breath and pain in her legs. Pt has trouble walking and changing positions in bed. Pt is 34 weeks 3 days pregnant, and when she experiences braxton hicks contractions, she feels pain in her chest. Pt with no other signs of acute distress at this time.

## 2024-01-05 NOTE — ED Provider Notes (Signed)
 [facility] Provider Note    Event Date/Time   First MD Initiated Contact with Patient 01/05/24 0935     (approximate)   History   Shortness of Breath   HPI  Colleen Steele is a 23 y.o. female who is [redacted] weeks pregnant and 3 days who comes in with concerns for shortness of breath and pain in her legs.  Patient reports that this is her third pregnancy.  She states that in her prior pregnancies and had to monitor her blood pressures but she is not currently on any medication for blood pressure at this time.   She reports having weeks of leg cramping, leg pain worse with trying to walk and feeling short of breath.  She reports telling her OB/GYN about it and they had not done anything about it recently.  She states that she feels short of breath but does have a history of asthma but states that she has not had any wheezing like her typical asthma.  She does report when she gets a [employer] contraction that she feels the pain up in her chest.  She denies any history of blood clots but she reports that her grandma is a doctor and told her to come in to get ultrasound to make sure she did not have DVTs.  She denies any swelling in her legs.  She denies any abdominal pain, gush of fluids, vaginal bleeding.  She reports feeling baby move but states that maybe is less than what she would expect.     Physical Exam   Triage Vital Signs: ED Triage Vitals  Encounter Vitals Group     BP 01/05/24 0932 119/69     Girls Systolic BP Percentile --      Girls Diastolic BP Percentile --      Boys Systolic BP Percentile --      Boys Diastolic BP Percentile --      Pulse Rate 01/05/24 0932 87     Resp 01/05/24 0932 20     Temp 01/05/24 0932 98.1 F (36.7 C)     Temp src --      SpO2 01/05/24 0932 97 %     Weight 01/05/24 0928 156 lb (70.8 kg)     Height 01/05/24 0928 5' 2 (1.575 m)     Head Circumference --      Peak Flow --      Pain Score --      Pain Loc --       Pain Education --      Exclude from Growth Chart --     Most recent vital signs: Vitals:   01/05/24 0932  BP: 119/69  Pulse: 87  Resp: 20  Temp: 98.1 F (36.7 C)  SpO2: 97%     General: Awake, no distress.  CV:  Good peripheral perfusion.  Resp:  Normal effort.  Clear lungs Abd:  Gravid uterus with no tenderness Other:  No swelling in legs no calf tenderness   ED Results / Procedures / Treatments   Labs (all labs ordered are listed, but only abnormal results are displayed) Labs Reviewed  BASIC METABOLIC PANEL WITH GFR  CBC  D-DIMER, QUANTITATIVE  POC URINE PREG, ED  TROPONIN I (HIGH SENSITIVITY)     EKG  My interpretation of EKG:  Sinus rate of 88 without any ST elevation or T wave inversions, normal intervals  RADIOLOGY I have reviewed the xray personally and interpreted no evidence of any pneumonia  PROCEDURES:  Critical Care performed: No  .1-3 Lead EKG Interpretation  Performed by: Lubertha Rush, MD Authorized by: Lubertha Rush, MD     Interpretation: normal     ECG rate:  90   ECG rate assessment: normal     Rhythm: sinus rhythm     Ectopy: none     Conduction: normal      MEDICATIONS ORDERED IN ED: Medications  ipratropium-albuterol  (DUONEB) 0.5-2.5 (3) MG/3ML nebulizer solution 3 mL (3 mLs Nebulization Given 01/05/24 1019)  iohexol  (OMNIPAQUE ) 350 MG/ML injection 75 mL (75 mLs Intravenous Contrast Given 01/05/24 1432)     IMPRESSION / MDM / ASSESSMENT AND PLAN / ED COURSE  I reviewed the triage vital signs and the nursing notes.   Patient's presentation is most consistent with acute presentation with potential threat to life or bodily function.   Patient comes in with shortness of breath and leg pain.  No obvious signs of DVT on examination but will proceed with ultrasounds to rule out blood clots given this is noninvasive in a safe test.  D-dimer ordered from triage but most likely will be positive in the setting of pregnancy.  Will  get cardiac markers to rule out ACS.  Blood pressures slightly elevated but not meeting criteria for preeclampsia.  We discussed x-ray to evaluate her lungs and she would like to proceed with this knowing there is a risk for radiation.  I do not hear any obvious wheezing to suggest an asthma flare but will trial a DuoNeb to see if this helps improve patient's symptoms.  Given has been ongoing for some weeks now I do not feel like COVID testing is necessary.  BMP is reassuring.  Her CBC shows stable hemoglobin of 11 no white count to suggest infection.  Troponin is negative.  Her D-dimer is elevated which is consistent with patient being pregnant we will need to discuss further about CT imaging after ultrasounds.  We discussed CT imaging and she did want to proceed with CT she understands the risk for radiation but she states that she wants to make sure there are no blood clots.  CT imaging is negative.  Discussed with patient her urine does have some slight white cells in it.  Will send for culture and start on Keflex .  Patient did report some decreased fetal movement when she initially presented.  States that she is feeling baby move now more after CT but I will discuss with OB team  D/w OB will send patient to L&D for further evaluation     The patient is on the cardiac monitor to evaluate for evidence of arrhythmia and/or significant heart rate changes.      FINAL CLINICAL IMPRESSION(S) / ED DIAGNOSES   Final diagnoses:  Urinary tract infection without hematuria, site unspecified  Shortness of breath     Rx / DC Orders   ED Discharge Orders     None        Note:  This document was prepared using Dragon voice recognition software and may include unintentional dictation errors.   Lubertha Rush, MD 01/05/24 [phone]

## 2024-01-07 LAB — URINE CULTURE

## 2024-01-07 NOTE — Progress Notes (Signed)
 ED Antimicrobial Stewardship Positive Culture Follow Up   Colleen Steele is an 23 y.o. female who presented to [facility] on 01/05/2024 with a chief complaint of  Chief Complaint  Patient presents with   Shortness of Breath    Recent Results (from the past 720 hours)  Urine Culture     Status: Abnormal   Collection Time: 01/05/24  2:14 PM   Specimen: Urine, Clean Catch  Result Value Ref Range Status   Specimen Description   Final    URINE, CLEAN CATCH Performed at [facility], [address], [place], [place] [address]    Special Requests   Final    NONE Performed at [facility], [address], [place], [place] [address]    Culture >=100,000 COLONIES/mL STAPHYLOCOCCUS SAPROPHYTICUS (A)  Final   Report Status 01/07/2024 FINAL  Final   Organism ID, Bacteria STAPHYLOCOCCUS SAPROPHYTICUS (A)  Final      Susceptibility   Staphylococcus saprophyticus - MIC*    CIPROFLOXACIN <=0.5 SENSITIVE Sensitive     GENTAMICIN <=0.5 SENSITIVE Sensitive     NITROFURANTOIN <=16 SENSITIVE Sensitive     OXACILLIN >=4 RESISTANT Resistant     TETRACYCLINE <=1 SENSITIVE Sensitive     VANCOMYCIN <=0.5 SENSITIVE Sensitive     TRIMETH/SULFA <=10 SENSITIVE Sensitive     RIFAMPIN <=0.5 SENSITIVE Sensitive     Inducible Clindamycin NEGATIVE Sensitive     * >=100,000 COLONIES/mL STAPHYLOCOCCUS SAPROPHYTICUS    [x]  Treated with cephalexin , organism resistant to prescribed antimicrobial  Patient is [redacted] weeks pregnant Recommendation for change to nitrofurantoin. Added Dr. Madelene Schanz to conversation with ED MD. Per Dr. Madelene Schanz (pts OB/GYN) : MD will have [name] CNM call in macrobid and contact patient   ED Provider: Dickson Founds PharmD Clinical Pharmacist 01/07/2024

## 2024-02-01 LAB — OB RESULTS CONSOLE GC/CHLAMYDIA
Chlamydia: NEGATIVE
Neisseria Gonorrhea: NEGATIVE

## 2024-02-01 LAB — OB RESULTS CONSOLE GBS: GBS: POSITIVE

## 2024-02-08 ENCOUNTER — Inpatient Hospital Stay
Admission: EM | Admit: 2024-02-08 | Discharge: 2024-02-10 | DRG: 806 | Disposition: A | Discharge status: Home / Self Care | Attending: Obstetrics | Admitting: Obstetrics

## 2024-02-08 ENCOUNTER — Encounter: Payer: Self-pay | Admitting: Obstetrics and Gynecology

## 2024-02-08 ENCOUNTER — Other Ambulatory Visit: Payer: Self-pay

## 2024-02-08 DIAGNOSIS — O1092 Unspecified pre-existing hypertension complicating childbirth: Secondary | ICD-10-CM | POA: Diagnosis present

## 2024-02-08 DIAGNOSIS — Z3A39 39 weeks gestation of pregnancy: Secondary | ICD-10-CM

## 2024-02-08 DIAGNOSIS — O99324 Drug use complicating childbirth: Secondary | ICD-10-CM | POA: Diagnosis present

## 2024-02-08 DIAGNOSIS — O10919 Unspecified pre-existing hypertension complicating pregnancy, unspecified trimester: Secondary | ICD-10-CM | POA: Diagnosis present

## 2024-02-08 DIAGNOSIS — D509 Iron deficiency anemia, unspecified: Secondary | ICD-10-CM | POA: Diagnosis not present

## 2024-02-08 DIAGNOSIS — B951 Streptococcus, group B, as the cause of diseases classified elsewhere: Secondary | ICD-10-CM | POA: Diagnosis present

## 2024-02-08 DIAGNOSIS — O4290 Premature rupture of membranes, unspecified as to length of time between rupture and onset of labor, unspecified weeks of gestation: Principal | ICD-10-CM | POA: Diagnosis present

## 2024-02-08 DIAGNOSIS — O0933 Supervision of pregnancy with insufficient antenatal care, third trimester: Secondary | ICD-10-CM

## 2024-02-08 DIAGNOSIS — O9081 Anemia of the puerperium: Secondary | ICD-10-CM | POA: Diagnosis not present

## 2024-02-08 DIAGNOSIS — F1290 Cannabis use, unspecified, uncomplicated: Secondary | ICD-10-CM | POA: Diagnosis present

## 2024-02-08 DIAGNOSIS — O99320 Drug use complicating pregnancy, unspecified trimester: Secondary | ICD-10-CM | POA: Diagnosis present

## 2024-02-08 DIAGNOSIS — O0930 Supervision of pregnancy with insufficient antenatal care, unspecified trimester: Secondary | ICD-10-CM

## 2024-02-08 DIAGNOSIS — Z8249 Family history of ischemic heart disease and other diseases of the circulatory system: Secondary | ICD-10-CM

## 2024-02-08 DIAGNOSIS — O99824 Streptococcus B carrier state complicating childbirth: Secondary | ICD-10-CM | POA: Diagnosis present

## 2024-02-08 DIAGNOSIS — O4292 Full-term premature rupture of membranes, unspecified as to length of time between rupture and onset of labor: Principal | ICD-10-CM | POA: Diagnosis present

## 2024-02-08 DIAGNOSIS — R7309 Other abnormal glucose: Secondary | ICD-10-CM | POA: Diagnosis present

## 2024-02-08 LAB — COMPREHENSIVE METABOLIC PANEL WITH GFR
ALT: 12 U/L (ref 0–44)
AST: 20 U/L (ref 15–41)
Albumin: 2.9 g/dL — ABNORMAL LOW (ref 3.5–5.0)
Alkaline Phosphatase: 192 U/L — ABNORMAL HIGH (ref 38–126)
Anion gap: 13 (ref 5–15)
BUN: 6 mg/dL (ref 6–20)
CO2: 20 mmol/L — ABNORMAL LOW (ref 22–32)
Calcium: 8.8 mg/dL — ABNORMAL LOW (ref 8.9–10.3)
Chloride: 103 mmol/L (ref 98–111)
Creatinine, Ser: 0.56 mg/dL (ref 0.44–1.00)
GFR, Estimated: >60 mL/min (ref >60)
Glucose, Bld: 70 mg/dL (ref 70–99)
Potassium: 3.8 mmol/L (ref 3.5–5.1)
Sodium: 136 mmol/L (ref 135–145)
Total Bilirubin: 1.3 mg/dL — ABNORMAL HIGH (ref 0.0–1.2)
Total Protein: 6 g/dL — ABNORMAL LOW (ref 6.5–8.1)

## 2024-02-08 LAB — URINE DRUG SCREEN, QUALITATIVE (ARMC ONLY)
Amphetamines, Ur Screen: NOT DETECTED
Barbiturates, Ur Screen: NOT DETECTED
Benzodiazepine, Ur Scrn: NOT DETECTED
Cannabinoid 50 Ng, Ur ~~LOC~~: POSITIVE — AB
Cocaine Metabolite,Ur ~~LOC~~: NOT DETECTED
MDMA (Ecstasy)Ur Screen: NOT DETECTED
Methadone Scn, Ur: NOT DETECTED
Opiate, Ur Screen: NOT DETECTED
Phencyclidine (PCP) Ur S: NOT DETECTED
Tricyclic, Ur Screen: NOT DETECTED

## 2024-02-08 LAB — PROTEIN / CREATININE RATIO, URINE
Creatinine, Urine: 56 mg/dL
Protein Creatinine Ratio: 0.3 mg/mg{creat} — ABNORMAL HIGH (ref 0.00–0.15)
Total Protein, Urine: 17 mg/dL

## 2024-02-08 LAB — CBC
HCT: 31.7 % — ABNORMAL LOW (ref 36.0–46.0)
Hemoglobin: 10.9 g/dL — ABNORMAL LOW (ref 12.0–15.0)
MCH: 30.7 pg (ref 26.0–34.0)
MCHC: 34.4 g/dL (ref 30.0–36.0)
MCV: 89.3 fL (ref 80.0–100.0)
Platelets: 206 K/uL (ref 150–400)
RBC: 3.55 MIL/uL — ABNORMAL LOW (ref 3.87–5.11)
RDW: 12.5 % (ref 11.5–15.5)
WBC: 10.1 K/uL (ref 4.0–10.5)
nRBC: 0 % (ref 0.0–0.2)

## 2024-02-08 LAB — HEMOGLOBIN A1C
Hgb A1c MFr Bld: 4.5 % — ABNORMAL LOW (ref 4.8–5.6)
Mean Plasma Glucose: 82.45 mg/dL

## 2024-02-08 LAB — TYPE AND SCREEN
ABO/RH(D): O POS
Antibody Screen: NEGATIVE

## 2024-02-08 MED ORDER — SODIUM CHLORIDE 0.9 % IV SOLN: 250.0000 mL | INTRAVENOUS | Status: DC | PRN

## 2024-02-08 MED ORDER — FENTANYL CITRATE (PF) 100 MCG/2ML IJ SOLN
50.0000 ug | INTRAMUSCULAR | Status: DC | PRN
  Administered 2024-02-08: 50 ug via INTRAVENOUS
  Filled 2024-02-08: qty 2

## 2024-02-08 MED ORDER — OXYTOCIN-SODIUM CHLORIDE 30-0.9 UT/500ML-% IV SOLN: 1.0000 m[IU]/min | INTRAVENOUS | Status: DC

## 2024-02-08 MED ORDER — LACTATED RINGERS IV SOLN: 500.0000 mL | INTRAVENOUS | Status: DC | PRN

## 2024-02-08 MED ORDER — SODIUM CHLORIDE 0.9% FLUSH: 3.0000 mL | INTRAVENOUS | Status: DC | PRN

## 2024-02-08 MED ORDER — ONDANSETRON HCL 4 MG/2ML IJ SOLN
4.0000 mg | Freq: Four times a day (QID) | INTRAMUSCULAR | Status: DC | PRN
  Administered 2024-02-08: 4 mg via INTRAVENOUS
  Filled 2024-02-08: qty 2

## 2024-02-08 MED ORDER — SOD CITRATE-CITRIC ACID 500-334 MG/5ML PO SOLN: 30.0000 mL | ORAL | Status: DC | PRN

## 2024-02-08 MED ORDER — OXYTOCIN-SODIUM CHLORIDE 30-0.9 UT/500ML-% IV SOLN: 2.5000 [IU]/h | INTRAVENOUS | Status: DC

## 2024-02-08 MED ORDER — ACETAMINOPHEN 500 MG PO TABS: 1000.0000 mg | ORAL_TABLET | Freq: Four times a day (QID) | ORAL | Status: DC | PRN

## 2024-02-08 MED ORDER — LIDOCAINE HCL (PF) 1 % IJ SOLN: 30.0000 mL | INTRAMUSCULAR | Status: DC | PRN

## 2024-02-08 MED ORDER — SODIUM CHLORIDE 0.9% FLUSH
3.0000 mL | Freq: Two times a day (BID) | INTRAVENOUS | Status: DC
  Administered 2024-02-09: 3 mL via INTRAVENOUS

## 2024-02-08 MED ORDER — ALBUTEROL SULFATE (2.5 MG/3ML) 0.083% IN NEBU
3.0000 mL | INHALATION_SOLUTION | RESPIRATORY_TRACT | Status: DC | PRN
  Administered 2024-02-08: 3 mL via RESPIRATORY_TRACT
  Filled 2024-02-08: qty 3

## 2024-02-08 MED ORDER — OXYTOCIN-SODIUM CHLORIDE 30-0.9 UT/500ML-% IV SOLN
INTRAVENOUS | Status: AC
  Administered 2024-02-08: 2 m[IU]/min via INTRAVENOUS
  Filled 2024-02-08: qty 500

## 2024-02-08 MED ORDER — MISOPROSTOL 200 MCG PO TABS
ORAL_TABLET | ORAL | Status: AC
  Filled 2024-02-08: qty 4

## 2024-02-08 MED ORDER — PENICILLIN G POT IN DEXTROSE 60000 UNIT/ML IV SOLN
3.0000 10*6.[IU] | INTRAVENOUS | Status: DC
  Administered 2024-02-08 (×2): 3 10*6.[IU] via INTRAVENOUS
  Filled 2024-02-08 (×2): qty 50

## 2024-02-08 MED ORDER — SALINE SPRAY 0.65 % NA SOLN
1.0000 | NASAL | Status: DC | PRN
  Filled 2024-02-08: qty 44

## 2024-02-08 MED ORDER — LACTATED RINGERS IV SOLN: INTRAVENOUS | Status: DC

## 2024-02-08 MED ORDER — AMMONIA AROMATIC IN INHA
RESPIRATORY_TRACT | Status: AC
  Filled 2024-02-08: qty 10

## 2024-02-08 MED ORDER — TERBUTALINE SULFATE 1 MG/ML IJ SOLN: 0.2500 mg | Freq: Once | INTRAMUSCULAR | Status: DC | PRN

## 2024-02-08 MED ORDER — LIDOCAINE HCL (PF) 1 % IJ SOLN
INTRAMUSCULAR | Status: AC
  Filled 2024-02-08: qty 30

## 2024-02-08 MED ORDER — OXYTOCIN BOLUS FROM INFUSION
333.0000 mL | Freq: Once | INTRAVENOUS | Status: DC
  Administered 2024-02-09: 333 mL via INTRAVENOUS

## 2024-02-08 MED ORDER — OXYTOCIN 10 UNIT/ML IJ SOLN
INTRAMUSCULAR | Status: AC
  Filled 2024-02-08: qty 2

## 2024-02-08 MED ORDER — SODIUM CHLORIDE 0.9 % IV SOLN
5.0000 10*6.[IU] | Freq: Once | INTRAVENOUS | Status: AC
  Administered 2024-02-08: 5 10*6.[IU] via INTRAVENOUS
  Filled 2024-02-08: qty 5

## 2024-02-08 NOTE — Progress Notes (Signed)
 L&D Note    Subjective:  Starting to feel more cramping   Objective:    Current Vital Signs 24h Vital Sign Ranges  T 98.1 F (36.7 C) Temp  Avg: 98.2 F (36.8 C)  Min: 98 F (36.7 C)  Max: 98.5 F (36.9 C)  BP (!) 145/83 BP  Min: 119/82  Max: 145/83  HR 76 Pulse  Avg: 80  Min: 72  Max: 92  RR 18 Resp  Avg: 18  Min: 18  Max: 18  SaO2     No data recorded       Vitals:   02/08/24 1243 02/08/24 1653 02/08/24 1719  BP: 119/82 (!) 125/95 (!) 145/83     Gen: alert, cooperative, no distress FHR: Baseline: 155 bpm, Variability: moderate, Accels: Present, Decels: none Toco: Irregular, mild contractions  SVE: Dilation: 3.5 Effacement (%): 60 Cervical Position: Posterior Station: -2 Presentation: Vertex Exam by:: ASABRA Pillow, CNM  Medications SCHEDULED MEDICATIONS   ammonia        lidocaine  (PF)       misoprostol        oxytocin        oxytocin  40 units in LR 1000 mL  333 mL Intravenous Once   Oxytocin -Sodium Chloride        sodium chloride  flush  3 mL Intravenous Q12H    MEDICATION INFUSIONS   sodium chloride      lactated ringers      lactated ringers  125 mL/hr at 02/08/24 1356   oxytocin      oxytocin      pencillin G potassium IV      PRN MEDICATIONS  sodium chloride , acetaminophen , ammonia , fentaNYL  (SUBLIMAZE ) injection, lactated ringers , lidocaine  (PF), lidocaine  (PF), misoprostol , ondansetron , oxytocin , Oxytocin -Sodium Chloride , sodium chloride  flush, sodium citrate-citric acid , terbutaline    Assessment & Plan:  23 y.o. [id number] at [redacted]w[redacted]d admitted for SROM  -Labor: Not in labor. Contractions are infrequent.  -Fetal Well-being: Category I -GBS: positive - treated with one dose of PCN at 1417 -Membranes ruptured, clear fluid, spontaneously at 1100 -Intervention: IV Pitocin  augmentation. Recommend proceeding with augmentation with oxytocin  since contractions are less frequent, GBS positive, new mild range blood pressures and concern for superimposed preeclampsia.   -Analgesia: unmedicated labor support options  -Chronic hypertension with superimposed preeclampsia - intermittently mild range blood pressures noted. Urine PCR elevated at 0.30. Currently asymptomatic.    Therisa CHRISTELLA Pillow, CNM  02/08/2024 5:31 PM  [facility]

## 2024-02-08 NOTE — H&P (Signed)
 OB History & Physical   History of Present Illness:   Chief Complaint: water  broke   HPI:  Colleen Steele is a 23 y.o. [id number] female at [redacted]w[redacted]d, Patient's last menstrual period was 05/09/2023 (approximate)., not consistent with US  at [redacted]w[redacted]d, with Estimated Date of Delivery: 02/15/24.  She presents to L&D for leaking clear fluid. She had a routine prenatal appointment today and did not have any concerns. She started to feel fluid trickling at 1100 followed by several larger gushes of clear fluid after her appointment.  Her pregnancy is complicated by Chronic HTN (no meds), late [facility] at 22 weeks, limited prenatal care with 5 prenatal appointments, marijuana use in pregnancy (last use 09/2023, and positive GBS status.  She denies Contractions or Vaginal bleeding. Endorses fetal movement as active.   Reports active fetal movement  Contractions: denies  LOF/SROM: 02/08/2024 at 1100 am, clear fluid  Vaginal bleeding: denies   Factors complicating pregnancy:  Principal Problem:   Delayed delivery after SROM (spontaneous rupture of membranes) Active Problems:   Chronic hypertension in pregnancy   Positive GBS test   Marijuana use during pregnancy   Late prenatal care   Limited prenatal care in third trimester   Elevated glucose tolerance test    Prenatal care site:  [facility]  Patient Active Problem List   Diagnosis Date Noted   Delayed delivery after SROM (spontaneous rupture of membranes) 02/08/2024   Positive GBS test 02/08/2024   Marijuana use during pregnancy 02/08/2024   Late prenatal care 02/08/2024   Limited prenatal care in third trimester 02/08/2024   Elevated glucose tolerance test 02/08/2024   Supervision of high risk pregnancy in third trimester 08/14/2023   Chronic hypertension in pregnancy 10/20/2021    Prenatal Transfer Tool  Maternal Diabetes: No Genetic Screening: Normal Maternal Ultrasounds/Referrals: Normal Fetal Ultrasounds or other Referrals:   None Maternal Substance Abuse:  Yes:  Type: Marijuana Significant Maternal Medications:  None Significant Maternal Lab Results: Group B Strep positive  Maternal Medical History:   Past Medical History:  Diagnosis Date   Asthma    Chlamydia    Chronic kidney disease    Diarrhea    Environmental allergies    Gonorrhea    Hypertension    Trichomonal infection 04/20/2018   Treated with Flagyl  x 2gm; negative TOC on 05/22/18     Vomiting     Past Surgical History:  Procedure Laterality Date   MOUTH SURGERY     NO PAST SURGERIES      No Known Allergies  Prior to Admission medications   Medication Sig Start Date End Date Taking? Authorizing Provider  acetaminophen  (TYLENOL ) 500 MG tablet Take 1,000 mg by mouth every 6 (six) hours as needed. Patient not taking: Reported on 02/08/2024    [provider]  benzocaine -Menthol  (DERMOPLAST) 20-0.5 % AERO Apply 1 application. topically as needed for irritation (perineal discomfort). Patient not taking: Reported on 02/08/2024 10/22/21   Tanda Edsel Fuller, CNM  calcium  carbonate (TUMS - DOSED IN MG ELEMENTAL CALCIUM ) 500 MG chewable tablet Chew 2 tablets (400 mg of elemental calcium  total) by mouth every 4 (four) hours as needed for indigestion. Patient not taking: Reported on 02/08/2024 08/08/21   Tanda Edsel Fuller, CNM  ferrous sulfate  325 (65 FE) MG tablet Take 1 tablet (325 mg total) by mouth 2 (two) times daily with a meal. Patient not taking: Reported on 02/08/2024 10/22/21   Tanda Edsel Fuller, CNM  ibuprofen  (ADVIL ) 600 MG tablet Take 1 tablet (  600 mg total) by mouth every 6 (six) hours. Patient not taking: Reported on 02/08/2024 10/22/21   Tanda Edsel Fuller, CNM  metoCLOPramide  (REGLAN ) 10 MG tablet Take 1 tablet (10 mg total) by mouth 3 (three) times daily with meals. 08/02/23 09/01/23  Ernest Ronal BRAVO, MD  ondansetron  (ZOFRAN -ODT) 4 MG disintegrating tablet Take 1 tablet (4 mg total) by mouth every 8 (eight) hours as  needed for nausea or vomiting. Patient not taking: Reported on 02/08/2024 07/02/23   Menshew, Candida LULLA Kings, PA-C  oxyCODONE -acetaminophen  (PERCOCET/ROXICET) 5-325 MG tablet Take 1 tablet by mouth every 4 (four) hours as needed for severe pain (pain score 7-10). Patient not taking: Reported on 02/08/2024 08/03/23   Sung, Jade J, MD  Prenatal Vit-Fe Fumarate-FA (MULTIVITAMIN-PRENATAL) 27-0.8 MG TABS tablet Take 1 tablet by mouth daily at 12 noon. Patient not taking: Reported on 02/08/2024    [provider]  witch hazel-glycerin  (TUCKS) pad Apply 1 application. topically as needed for hemorrhoids (for pain). Patient not taking: Reported on 02/08/2024 10/22/21   Tanda Edsel Fuller, CNM    OB History  Gravida Para Term Preterm AB Living  4 2 2  0 1 2  SAB IAB Ectopic Multiple Live Births  0 0 0 0 2    # Outcome Date GA Lbr Len/2nd Weight Sex Type Anes PTL Lv  4 Current           3 Term 10/21/21 [redacted]w[redacted]d  3221 g M Vag-Spont None N LIV     Name: Dimple  2 Term 09/05/18 [redacted]w[redacted]d / 00:01 3125 g F Vag-Spont None  LIV     Name: [name]     Apgar1: 9  Apgar5: 9  1 AB              Social History: She  reports that she has never smoked. She has never used smokeless tobacco. She reports that she does not currently use alcohol. She reports current drug use. Drug: Marijuana.  Family History: family history includes Diverticulitis in her sister; Hypertension in her mother.   Review of Systems: A full review of systems was performed and negative except as noted in the HPI.     Physical Exam:  Vital Signs: BP 119/82 (BP Location: Left Arm)   Pulse 92   Temp 98 F (36.7 C) (Oral)   Resp 18   Ht 5' 2 (1.575 m)   Wt 72.1 kg   LMP 05/09/2023 (Approximate)   BMI 29.08 kg/m   General: no acute distress.  HEENT: normocephalic, atraumatic Heart: regular rate & rhythm Lungs: normal respiratory effort Abdomen: soft, gravid, non-tender;  EFW: 7lbs  Pelvic:   External: Normal external  female genitalia  Cervix: Dilation: 3 / Effacement (%): 60 / Station: -2    Extremities: non-tender, symmetric, No edema bilaterally.  DTRs: 2+/2+  Neurologic: Alert & oriented x 3.    Results for orders placed or performed during the hospital encounter of 02/08/24 (from the past 24 hours)  CBC     Status: Abnormal   Collection Time: 02/08/24 12:52 PM  Result Value Ref Range   WBC 10.1 4.0 - 10.5 K/uL   RBC 3.55 (L) 3.87 - 5.11 MIL/uL   Hemoglobin 10.9 (L) 12.0 - 15.0 g/dL   HCT 68.2 (L) 63.9 - 53.9 %   MCV 89.3 80.0 - 100.0 fL   MCH 30.7 26.0 - 34.0 pg   MCHC 34.4 30.0 - 36.0 g/dL   RDW 87.4 88.4 - 84.4 %  Platelets 206 150 - 400 K/uL   nRBC 0.0 0.0 - 0.2 %  Type and screen [facility]     Status: None   Collection Time: 02/08/24 12:52 PM  Result Value Ref Range   ABO/RH(D) O POS    Antibody Screen NEG    Sample Expiration      02/11/2024,2359 Performed at [facility], [address] [place], [place] [address]   Comprehensive metabolic panel with GFR     Status: Abnormal   Collection Time: 02/08/24  1:07 PM  Result Value Ref Range   Sodium 136 135 - 145 mmol/L   Potassium 3.8 3.5 - 5.1 mmol/L   Chloride 103 98 - 111 mmol/L   CO2 20 (L) 22 - 32 mmol/L   Glucose, Bld 70 70 - 99 mg/dL   BUN 6 6 - 20 mg/dL   Creatinine, Ser 9.43 0.44 - 1.00 mg/dL   Calcium  8.8 (L) 8.9 - 10.3 mg/dL   Total Protein 6.0 (L) 6.5 - 8.1 g/dL   Albumin 2.9 (L) 3.5 - 5.0 g/dL   AST 20 15 - 41 U/L   ALT 12 0 - 44 U/L   Alkaline Phosphatase 192 (H) 38 - 126 U/L   Total Bilirubin 1.3 (H) 0.0 - 1.2 mg/dL   GFR, Estimated >39 >39 mL/min   Anion gap 13 5 - 15    Pertinent Results:  Prenatal Labs: Blood type/Rh O POS   Antibody screen Negative    Rubella Immune    Varicella Immune  RPR NR    HBsAg Neg   Hep C NR   HIV Neg    GC neg  Chlamydia neg  Genetic screening cfDNA negative   1 hour GTT 155  3 hour GTT Not done - Random glucose on admission 70   GBS Pos     FHT:  FHR: 145 bpm, variability: moderate,  accelerations:  Present,  decelerations:  Absent Category/reactivity:  Category I UC:   Irregular, mild contractions    Cephalic by Le[place] and SVE   No results found.  Assessment:  Colleen Steele is a 23 y.o. [id number] female at [redacted]w[redacted]d with SROM.   Plan:  1. Admit to Labor & Delivery - Admission status: Inpatient - Dr. CHARM Dinsmore MD notified of admission and plan of care  - Reason for admission: labor management - consents reviewed and obtained  2. Fetal Well being  - Fetal Tracing: Cat 1 - Group B Streptococcus ppx indicated: GBS positive - Presentation: cephalic confirmed by SVE   3. Routine OB: - Prenatal labs reviewed, as above - Rh positive - CBC, T&S, RPR on admit - Regular diet, saline lock  4. Monitoring of labor  - Contractions monitored with external toco - Pelvis proven to 3221 grams, adequate for trial of labor  - Plan for expectant management initially  - Discussed risk/benefits of augmentation versus expectant management in the setting of SROM with particular consideration for GBS positive status. Reviewed we do not recommend prolonged expectant management due the risk of infection.  - She is currently 3-4 cm and having irregular contractions. Plan to start GBS prophylaxis with PCN and start augmentation with PCN with the 2nd dose unless there are signs of labor.  - Plan for  continuous fetal monitoring - Maternal pain control as desired; planning IVPM - Anticipate vaginal delivery  5. Marijuana use in pregnancy  - Reports last use 09/2023  - UDS in pregnancy positive for THC 10/16/2023 -  UDS ordered for current admission   6. Chronic hypertension  - Not currently on antihypertensives  - Blood pressures well controlled  - Preeclampsia baseline labs ordered   7. Late/Limited prenatal care  - Initial OB appt at 22 weeks with 4 subsequent prenatal visits   8. Elevated 1hr GTT - 1hr GTT  155, did not complete 3hr GCT  - A1C ordered with admission labs  - Random glucose on admission completed - 70   9. Post Partum Planning: - Infant feeding: breast feeding - Contraception: Nexplanon  versus BTL (consent signed 11/28/2023) - Flu vaccine: declined  - Tdap vaccine: Given prenatally - RSV vaccine: Not in season   Therisa CHRISTELLA Pillow, [place] 02/08/24 3:00 PM  Therisa Pillow, CNM Certified Nurse Midwife [facility]

## 2024-02-08 NOTE — Progress Notes (Signed)
 L&D Note    Subjective:  Feeling intense contractions, intermittent pressure, spouse supportive at bedside   Objective:    Current Vital Signs 24h Vital Sign Ranges  T 97.7 F (36.5 C) Temp  Avg: 98.1 F (36.7 C)  Min: 97.7 F (36.5 C)  Max: 98.5 F (36.9 C)  BP 102/78 BP  Min: 102/78  Max: 145/83  HR (!) 115 Pulse  Avg: 87.6  Min: 68  Max: 138  RR 16 Resp  Avg: 17  Min: 16  Max: 18  SaO2 99 % Room Air SpO2  Avg: 99 %  Min: 99 %  Max: 99 %      Gen: alert, cooperative, no distress FHR: Baseline: 135 bpm, Variability: moderate, Accels: Present, Decels: none Toco: regular, every 2-4 minutes SVE: Dilation: 8 Effacement (%): 90 Cervical Position: Middle Station: 0 Presentation: Vertex Exam by:: D Burns, RN  Medications SCHEDULED MEDICATIONS   ammonia        lidocaine  (PF)       misoprostol        oxytocin        oxytocin  40 units in LR 1000 mL  333 mL Intravenous Once   sodium chloride  flush  3 mL Intravenous Q12H    MEDICATION INFUSIONS   sodium chloride      lactated ringers      lactated ringers  125 mL/hr at 02/08/24 1356   oxytocin      oxytocin  8 milli-units/min (02/08/24 2056)   pencillin G potassium IV 3 Million Units (02/08/24 2229)    PRN MEDICATIONS  sodium chloride , acetaminophen , albuterol , ammonia , fentaNYL  (SUBLIMAZE ) injection, lactated ringers , lidocaine  (PF), lidocaine  (PF), misoprostol , ondansetron , oxytocin , sodium chloride , sodium chloride  flush, sodium citrate-citric acid , terbutaline    Assessment & Plan:  23 y.o. [id number] at [redacted]w[redacted]d admitted for SROM -Labor: Active phase labor. -Fetal Well-being: Category I -GBS: positive - PCN x 3 doses  -Membranes ruptured, clear fluid, spontaneously at 1100  -Continue present management. -Analgesia: IVPM   Colleen Steele, CNM  02/08/2024 11:50 PM  [facility]

## 2024-02-08 NOTE — Progress Notes (Signed)
 Pt is a 23yo [id number], [redacted]w[redacted]d. She arrived to the unit grossly ruptured. She states she has had clear LOF since 1100. She denies vaginal bleeding, reports positive fetal movement, and denies contractions. VS stable, monitors applied and assessing. Initial [id number] at 1236.

## 2024-02-08 NOTE — Progress Notes (Signed)
 L&D Note    Subjective:  Feeling contractions more frequently, rating 3/10  Objective:    Current Vital Signs 24h Vital Sign Ranges  T 97.7 F (36.5 C) Temp  Avg: 98.1 F (36.7 C)  Min: 97.7 F (36.5 C)  Max: 98.5 F (36.9 C)  BP 102/78 BP  Min: 102/78  Max: 145/83  HR (!) 115 Pulse  Avg: 87.6  Min: 68  Max: 138  RR 16 Resp  Avg: 17  Min: 16  Max: 18  SaO2 99 % Room Air SpO2  Avg: 99 %  Min: 99 %  Max: 99 %       FHR: Baseline: 135 bpm, Variability: moderate, Accels: Present, Decels: none Toco: regular, every 2-5 minutes SVE: Dilation: 4 Effacement (%): 60 Cervical Position: Posterior Station: -2 Presentation: Vertex Exam by:: D Burns, RN  Medications SCHEDULED MEDICATIONS   ammonia        lidocaine  (PF)       misoprostol        oxytocin        oxytocin  40 units in LR 1000 mL  333 mL Intravenous Once   sodium chloride  flush  3 mL Intravenous Q12H    MEDICATION INFUSIONS   sodium chloride      lactated ringers      lactated ringers  125 mL/hr at 02/08/24 1356   oxytocin      oxytocin  8 milli-units/min (02/08/24 2056)   pencillin G potassium IV 3 Million Units (02/08/24 1822)    PRN MEDICATIONS  sodium chloride , acetaminophen , albuterol , ammonia , fentaNYL  (SUBLIMAZE ) injection, lactated ringers , lidocaine  (PF), lidocaine  (PF), misoprostol , ondansetron , oxytocin , sodium chloride , sodium chloride  flush, sodium citrate-citric acid , terbutaline    Assessment & Plan:  23 y.o. [id number] at [redacted]w[redacted]d admitted for SROM -Labor: Augmented labor with oxytocin   -Fetal Well-being: Category I -GBS: positive - PCN x 2 doses, due for 3rd dose at 2230 -Membranes ruptured, clear fluid, spontaneously at 1100  -Continue present management. Titrate oxytocin  for labor and fetal tolerance  -Analgesia: unmedicated labor support options    Therisa CHRISTELLA Pillow, [place]  02/08/2024 10:20 PM  [facility]

## 2024-02-09 ENCOUNTER — Encounter: Payer: Self-pay | Admitting: Obstetrics and Gynecology

## 2024-02-09 DIAGNOSIS — O26893 Other specified pregnancy related conditions, third trimester: Secondary | ICD-10-CM | POA: Diagnosis present

## 2024-02-09 DIAGNOSIS — D509 Iron deficiency anemia, unspecified: Secondary | ICD-10-CM | POA: Diagnosis not present

## 2024-02-09 DIAGNOSIS — O99824 Streptococcus B carrier state complicating childbirth: Secondary | ICD-10-CM | POA: Diagnosis present

## 2024-02-09 DIAGNOSIS — Z3A39 39 weeks gestation of pregnancy: Secondary | ICD-10-CM | POA: Diagnosis not present

## 2024-02-09 DIAGNOSIS — O99324 Drug use complicating childbirth: Secondary | ICD-10-CM | POA: Diagnosis present

## 2024-02-09 DIAGNOSIS — O4290 Premature rupture of membranes, unspecified as to length of time between rupture and onset of labor, unspecified weeks of gestation: Secondary | ICD-10-CM | POA: Diagnosis present

## 2024-02-09 DIAGNOSIS — Z8249 Family history of ischemic heart disease and other diseases of the circulatory system: Secondary | ICD-10-CM | POA: Diagnosis not present

## 2024-02-09 DIAGNOSIS — F1290 Cannabis use, unspecified, uncomplicated: Secondary | ICD-10-CM | POA: Diagnosis present

## 2024-02-09 DIAGNOSIS — O9081 Anemia of the puerperium: Secondary | ICD-10-CM | POA: Diagnosis not present

## 2024-02-09 DIAGNOSIS — O1092 Unspecified pre-existing hypertension complicating childbirth: Secondary | ICD-10-CM | POA: Diagnosis present

## 2024-02-09 DIAGNOSIS — O4292 Full-term premature rupture of membranes, unspecified as to length of time between rupture and onset of labor: Secondary | ICD-10-CM | POA: Diagnosis present

## 2024-02-09 LAB — CBC
HCT: 28.6 % — ABNORMAL LOW (ref 36.0–46.0)
Hemoglobin: 9.9 g/dL — ABNORMAL LOW (ref 12.0–15.0)
MCH: 31.2 pg (ref 26.0–34.0)
MCHC: 34.6 g/dL (ref 30.0–36.0)
MCV: 90.2 fL (ref 80.0–100.0)
Platelets: 188 K/uL (ref 150–400)
RBC: 3.17 MIL/uL — ABNORMAL LOW (ref 3.87–5.11)
RDW: 12.4 % (ref 11.5–15.5)
WBC: 11.8 K/uL — ABNORMAL HIGH (ref 4.0–10.5)
nRBC: 0 % (ref 0.0–0.2)

## 2024-02-09 LAB — RPR: RPR Ser Ql: NONREACTIVE

## 2024-02-09 MED ORDER — SENNOSIDES-DOCUSATE SODIUM 8.6-50 MG PO TABS
2.0000 | ORAL_TABLET | Freq: Every day | ORAL | Status: DC
  Administered 2024-02-10: 2 via ORAL
  Filled 2024-02-09: qty 2

## 2024-02-09 MED ORDER — ACETAMINOPHEN 500 MG PO TABS
1000.0000 mg | ORAL_TABLET | Freq: Four times a day (QID) | ORAL | Status: DC
  Administered 2024-02-09 – 2024-02-10 (×6): 1000 mg via ORAL
  Filled 2024-02-09 (×6): qty 2

## 2024-02-09 MED ORDER — SODIUM CHLORIDE 0.9% FLUSH
3.0000 mL | Freq: Two times a day (BID) | INTRAVENOUS | Status: DC
  Administered 2024-02-09: 3 mL via INTRAVENOUS

## 2024-02-09 MED ORDER — OXYCODONE HCL 5 MG PO TABS: 10.0000 mg | ORAL_TABLET | ORAL | Status: DC | PRN

## 2024-02-09 MED ORDER — PRENATAL MULTIVITAMIN CH
1.0000 | ORAL_TABLET | Freq: Every day | ORAL | Status: DC
  Administered 2024-02-09 – 2024-02-10 (×2): 1 via ORAL
  Filled 2024-02-09 (×3): qty 1

## 2024-02-09 MED ORDER — FERROUS SULFATE 325 (65 FE) MG PO TABS
325.0000 mg | ORAL_TABLET | Freq: Two times a day (BID) | ORAL | Status: DC
  Administered 2024-02-09 – 2024-02-10 (×3): 325 mg via ORAL
  Filled 2024-02-09 (×3): qty 1

## 2024-02-09 MED ORDER — OXYCODONE HCL 5 MG PO TABS: 5.0000 mg | ORAL_TABLET | ORAL | Status: DC | PRN

## 2024-02-09 MED ORDER — SODIUM CHLORIDE 0.9% FLUSH: 3.0000 mL | INTRAVENOUS | Status: DC | PRN

## 2024-02-09 MED ORDER — DIBUCAINE (PERIANAL) 1 % EX OINT: 1.0000 | TOPICAL_OINTMENT | CUTANEOUS | Status: DC | PRN

## 2024-02-09 MED ORDER — ALBUTEROL SULFATE (2.5 MG/3ML) 0.083% IN NEBU
INHALATION_SOLUTION | RESPIRATORY_TRACT | Status: AC
  Administered 2024-02-09: 2.5 mg
  Filled 2024-02-09: qty 3

## 2024-02-09 MED ORDER — IBUPROFEN 600 MG PO TABS
600.0000 mg | ORAL_TABLET | Freq: Four times a day (QID) | ORAL | Status: DC
  Administered 2024-02-09 – 2024-02-10 (×6): 600 mg via ORAL
  Filled 2024-02-09 (×6): qty 1

## 2024-02-09 MED ORDER — ONDANSETRON HCL 4 MG/2ML IJ SOLN: 4.0000 mg | INTRAMUSCULAR | Status: DC | PRN

## 2024-02-09 MED ORDER — ONDANSETRON HCL 4 MG PO TABS: 4.0000 mg | ORAL_TABLET | ORAL | Status: DC | PRN

## 2024-02-09 MED ORDER — COCONUT OIL OIL: 1.0000 | TOPICAL_OIL | Status: DC | PRN

## 2024-02-09 MED ORDER — ZOLPIDEM TARTRATE 5 MG PO TABS: 5.0000 mg | ORAL_TABLET | Freq: Every evening | ORAL | Status: DC | PRN

## 2024-02-09 MED ORDER — BENZOCAINE-MENTHOL 20-0.5 % EX AERO: 1.0000 | INHALATION_SPRAY | CUTANEOUS | Status: DC | PRN

## 2024-02-09 MED ORDER — WITCH HAZEL-GLYCERIN EX PADS: 1.0000 | MEDICATED_PAD | CUTANEOUS | Status: DC | PRN

## 2024-02-09 MED ORDER — SODIUM CHLORIDE 0.9 % IV SOLN: 250.0000 mL | INTRAVENOUS | Status: DC | PRN

## 2024-02-09 MED ORDER — DIPHENHYDRAMINE HCL 25 MG PO CAPS: 25.0000 mg | ORAL_CAPSULE | Freq: Four times a day (QID) | ORAL | Status: DC | PRN

## 2024-02-09 MED ORDER — SIMETHICONE 80 MG PO CHEW: 80.0000 mg | CHEWABLE_TABLET | ORAL | Status: DC | PRN

## 2024-02-09 MED ORDER — ALBUTEROL SULFATE (2.5 MG/3ML) 0.083% IN NEBU: 3.0000 mL | INHALATION_SOLUTION | RESPIRATORY_TRACT | Status: DC | PRN

## 2024-02-09 NOTE — Discharge Instructions (Addendum)
 [facility]

## 2024-02-09 NOTE — Progress Notes (Signed)
 Post Partum Day 0 Subjective: Doing well, no complaints.  Tolerating regular diet, pain with PO meds, voiding and ambulating without difficulty.  No CP SOB Fever,Chills, N/V or leg pain; denies nipple or breast pain, no HA change of vision, RUQ/epigastric pain  Objective: BP 122/77 (BP Location: Right Arm)   Pulse (!) 50 Comment: nurse Anna notified  Temp 98 F (36.7 C) (Oral)   Resp 19   Ht 5' 2 (1.575 m)   Wt 72.1 kg   LMP 05/09/2023 (Approximate)   SpO2 100%   Breastfeeding Unknown   BMI 29.08 kg/m    Physical Exam:  General: NAD Breasts: soft/nontender CV: RRR Pulm: nl effort, CTABL Abdomen: soft, NT, BS x 4 Perineum: minimal edema, intact Lochia: moderate Uterine Fundus: fundus firm and 1 fb below umbilicus DVT Evaluation: no cords, ttp LEs   Recent Labs    02/08/24 1252 02/09/24 0508  HGB 10.9* 9.9*  HCT 31.7* 28.6*  WBC 10.1 11.8*  PLT 206 188    Assessment/Plan: 23 y.o. [id number] postpartum day # 0  - Continue routine PP care - Lactation consult PRN - Discussed contraceptive options including implant, IUDs hormonal and non-hormonal, injection, pills/ring/patch, condoms, and NFP.  - Acute blood loss anemia, clinically insignificant - hemoglobin changed from 10.9 to 9.9, patient is asymptomatic, hemodynamically stable; start po ferrous sulfate  BID with stool softeners - Immunization status: all Imms up to date  Disposition: Does  not desire Dc home today.   Edsel Charlies Blush, CNM 02/09/2024 9:36 AM

## 2024-02-09 NOTE — Clinical Social Work Maternal (Signed)
 CLINICAL SOCIAL WORK MATERNAL/CHILD NOTE  Patient Details  Name: Colleen Steele MRN: [id number] Date of Birth: 2000/12/15  Date:  02/09/2024  Clinical Social Worker Initiating Note:  Corrie Ruts Date/Time: Initiated:  02/09/24/1130     Child's Name:  Colleen Steele   Biological Parents:  Mother, Father   Need for Interpreter:  None   Reason for Referral:  Current Substance Use/Substance Use During Pregnancy     Address:  [address] 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   Education:  Research scientist (physical sciences)   Homebound arranged:    Surveyor, quantity Resources:  Medicaid   Other Resources:   (Patient reports no WIC or FS.)   Cultural/Religious Considerations Which May Impact Care:    Strengths:  Compliance with medical plan  , Ability to meet basic needs  , Home prepared for child  , Pediatrician chosen   Psychotropic Medications:         Pediatrician:    [employer]  Pediatrician List:   [facility]    [place]    [facility]  [facility]      Pediatrician Fax Number:    Risk Factors/Current Problems:  Substance Use     Cognitive State:  Alert     Mood/Affect:  Calm  , Interested     CSW Assessment:  Chart reviewed. I spoke with Colleen Steele today. I introduced myself, role, reason for consult, and HIPAA  policy.   I consulted with the patient about a positive Marijuana screen.   Colleen Steele reports that she was feeling ok after delivery. The patient confirmed that her address is [address] [place] [address] and her telephone number is [phone]. The patient also confirmed that [facility] is the FOB.   The patient reports that her mom, sister, FOB sisters, and mother is supportive. The patient confirms that her highest level of education is an associates degree and currently in school to obtain her bachelors degree. The reports that she currently works for [employer].   Colleen Steele reports that her daughter, Colleen Steele (5) and Son, Colleen Steele (2) lives in the home. The patient reports not having WIC or Food Stamps. The patient did not report any behavioral health history. Colleen Steele denies any past or current SI/HI/DV.   I reviewed information about Post Partum Depression, Sudden Infant Death Syndrome, Psychiatrist, Safe Sleep Environment. The patient verbalized understanding.I provided the patient with behavioral health resources and breast feeding and marijuana use.   The patient confirmed that she has a crib, pack and play, diapers, clothes,  and car seat. The patient reports that she will breast feed and that medicaid will provide her with a breast pump. Patient reports that the FOB will support her at discharge.   I also explained to Colleen Steele per law and hospital policy if a toxicological screen is positive for Colleen then CPS has to be contacted. The patient verbalized understanding.   The patient reports last use was almost a month ago. She reported having no history of CPS involvement.   I spoke with Colleen Steele at CPS and the report was made.   TOC will follow Colleen Steele and Colleen Steele until discharge.       CSW Plan/Description:  Sudden Infant Death Syndrome (SIDS) Education, Perinatal Mood and Anxiety Disorder (PMADs) Education,  Hospital Drug Screen Policy Information, Child Protective Service Report      Corrie JINNY Ruts, LCSW 02/09/2024, 3:20 PM

## 2024-02-09 NOTE — Discharge Summary (Addendum)
 Postpartum Discharge Summary  Patient Name: Colleen Steele DOB: 2000/09/13 MRN: [id number]  Date of admission: 02/08/2024 Delivery date:02/09/2024 Delivering provider: MACKIE, ANNA M Date of discharge: 02/10/2024  Primary [facility]: [facility] OFE:Ejupzwu'd last menstrual period was 05/09/2023 (approximate). EDC Estimated Date of Delivery: 02/15/24 Gestational Age at Delivery: [redacted]w[redacted]d   Admitting diagnosis: Delayed delivery after SROM (spontaneous rupture of membranes) [O42.90] Delayed delivery after spontaneous rupture of membranes [O42.90] Intrauterine pregnancy: [redacted]w[redacted]d     Secondary diagnosis:   Principal Problem:   Delayed delivery after SROM (spontaneous rupture of membranes) Active Problems:   Chronic hypertension in pregnancy   Positive GBS test   Marijuana use during pregnancy   Late prenatal care   Limited prenatal care in third trimester   Elevated glucose tolerance test   Delayed delivery after spontaneous rupture of membranes   Discharge Diagnosis: Term Pregnancy Delivered and [facility] course: Onset of Labor With Vaginal Delivery      23 y.o. yo [phone] at [redacted]w[redacted]d was admitted in Latent Labor on 02/08/2024. Labor course was uncomplicated. Oxytocine was started for augmentation. She progressed well to C/C/+2 with a strong urge to push. She pushed effectively over approximately 5 minutes for a spontaneous vaginal birth.  Membrane Rupture Time/Date: 11:00 AM,02/08/2024  Delivery Method:Vaginal, Spontaneous Operative Delivery:N/A Episiotomy: None Lacerations:  None Patient had a postpartum course complicated by iron deficiency anemia she was started on oral iron supplements twice a day.  Blood pressures were  controlled without oral antihypertensives, she is scheduled for close f/u with a BP check in 3 days. Proteinuria was noted on admission. Reviewed the possibility of contamination from amniotic fluid. She did not have symptoms of preeclampsia during admission.  She is ambulating, tolerating a regular diet, passing flatus, and urinating well. Patient is discharged home in stable condition on 02/10/24.  Newborn Data: Birth date:02/09/2024 Birth time:12:17 AM Gender:Female Living status:Living Apgars:8 ,9  Weight:3240 g                                            Post partum procedures:none Augmentation:: Pitocin  Complications: None Delivery Type: spontaneous vaginal delivery Anesthesia: IV narcotics Placenta: spontaneous To Pathology: No   Prenatal Labs:  Blood type/Rh O POS   Antibody screen Negative    Rubella Immune    Varicella Immune  RPR NR    HBsAg Neg   Hep C NR   HIV Neg    GC neg  Chlamydia neg  Genetic screening cfDNA negative   1 hour GTT 155  3 hour GTT Not done - Random glucose on admission 70, A1C 4.5  GBS Pos      Magnesium  Sulfate received: No BMZ received: No Rhophylac:was not indicated MMR: was not indicated Varivax vaccine given: was not indicated - Tdap vaccine: Given prenatally - Flu vaccine: declined  -RSV vaccine: not in season  Transfusion:No  Physical exam  Vitals:   02/09/24 2346 02/10/24 0356 02/10/24 0455 02/10/24 0837  BP: 124/85 121/85  131/85  Pulse: 66 (!) 48 (!) 56 67  Resp: 20 20 18 20   Temp: 98.2 F (36.8 C) (!) 97.5 F (36.4 C) 97.8 F (36.6 C) 97.6 F (36.4 C)  TempSrc: Oral Oral Oral Oral  SpO2: 98% 100%  100%  Weight:      Height:       General: alert, cooperative, and  no distress Lochia: appropriate Uterine Fundus: firm Perineum: minimal edema/intact DVT Evaluation: No evidence of DVT seen on physical exam.  Labs: Lab Results  Component Value Date   WBC 11.8 (H) 02/09/2024   HGB 9.9 (L) 02/09/2024   HCT 28.6 (L) 02/09/2024   MCV 90.2 02/09/2024   PLT 188 02/09/2024      Latest Ref Rng & Units 02/08/2024    1:07 PM  CMP  Glucose 70 - 99 mg/dL 70   BUN 6 - 20 mg/dL 6   Creatinine 9.55 - 8.99 mg/dL 9.43   Sodium 864 - 854 mmol/L 136   Potassium 3.5 - 5.1  mmol/L 3.8   Chloride 98 - 111 mmol/L 103   CO2 22 - 32 mmol/L 20   Calcium  8.9 - 10.3 mg/dL 8.8   Total Protein 6.5 - 8.1 g/dL 6.0   Total Bilirubin 0.0 - 1.2 mg/dL 1.3   Alkaline Phos 38 - 126 U/L 192   AST 15 - 41 U/L 20   ALT 0 - 44 U/L 12    Edinburgh Score:    10/21/2021    4:56 PM  Edinburgh Postnatal Depression Scale Screening Tool  I have been able to laugh and see the funny side of things. 0  I have looked forward with enjoyment to things. 0  I have blamed myself unnecessarily when things went wrong. 0  I have been anxious or worried for no good reason. 0  I have felt scared or panicky for no good reason. 0  Things have been getting on top of me. 0  I have been so unhappy that I have had difficulty sleeping. 0  I have felt sad or miserable. 0  I have been so unhappy that I have been crying. 0  The thought of harming myself has occurred to me. 0  Edinburgh Postnatal Depression Scale Total 0      Data saved with a previous flowsheet row definition    Risk assessment for postpartum VTE and prophylactic treatment: Very high risk factors: None High risk factors: None Moderate risk factors: None  Postpartum VTE prophylaxis with LMWH not indicated  After visit meds:  Allergies as of 02/10/2024   No Known Allergies      Medication List     STOP taking these medications    metoCLOPramide  10 MG tablet Commonly known as: REGLAN        TAKE these medications    acetaminophen  500 MG tablet Commonly known as: TYLENOL  Take 2 tablets (1,000 mg total) by mouth every 6 (six) hours.   albuterol  (2.5 MG/3ML) 0.083% nebulizer solution Commonly known as: PROVENTIL  Inhale 3 mLs into the lungs every 4 (four) hours as needed for wheezing or shortness of breath.   benzocaine -Menthol  20-0.5 % Aero Commonly known as: DERMOPLAST Apply 1 Application topically as needed for irritation (perineal discomfort).   coconut oil Oil Apply 1 Application topically as needed.    dibucaine 1 % Oint Commonly known as: NUPERCAINAL Place 1 Application rectally as needed for hemorrhoids.   ferrous sulfate  325 (65 FE) MG tablet Take 1 tablet (325 mg total) by mouth 2 (two) times daily with a meal.   ibuprofen  600 MG tablet Commonly known as: ADVIL  Take 1 tablet (600 mg total) by mouth every 6 (six) hours.   multivitamin-prenatal 27-0.8 MG Tabs tablet Take 1 tablet by mouth daily at 12 noon.   senna-docusate 8.6-50 MG tablet Commonly known as: Senokot-S Take 2 tablets by mouth daily. Start taking  on: February 11, 2024   simethicone  80 MG chewable tablet Commonly known as: MYLICON Chew 1 tablet (80 mg total) by mouth as needed for flatulence.   witch hazel-glycerin  pad Commonly known as: TUCKS Apply 1 Application topically as needed for hemorrhoids.       Discharge home in stable condition Infant Feeding: Bottle and Breast Infant Disposition:home with mother Discharge instruction: per After Visit Summary and Postpartum booklet. Activity: Advance as tolerated. Pelvic rest for 6 weeks.  Diet: routine diet Anticipated Birth Control:  Contraceptives: considering Nexplanon  or interval BTL Postpartum Appointment:6 weeks Additional Postpartum F/U: BP check 2-3 days and consult for BTL if desired  Future Appointments:No future appointments. Follow up Visit:  Follow-up Information     Aisha Heller, CNM. Schedule an appointment as soon as possible for a visit in 3 day(s).   Specialty: Obstetrics Why: blood pressure check Contact information: [address] [place] [address] [phone]         Vernel Therisa HERO, CNM. Schedule an appointment as soon as possible for a visit in 6 week(s).   Specialty: Certified Nurse Midwife Why: postpartum visit Contact information: [address] [place] [address] [phone]         Schermerhorn, Beverli GAILS, MD. Schedule an appointment as soon as possible for a visit in 2 week(s).    Specialty: Obstetrics and Gynecology Why: to discuss BTL if desired Contact information: [address] [place] [address] [phone]                 Plan:  Caleesi Vankirk was discharged to home in good condition. Follow-up appointment as directed.    Signed: Heller Aisha CNM

## 2024-02-10 MED ORDER — COCONUT OIL OIL: 1.0000 | TOPICAL_OIL | Status: AC | PRN

## 2024-02-10 MED ORDER — ALBUTEROL SULFATE (2.5 MG/3ML) 0.083% IN NEBU: 3.0000 mL | INHALATION_SOLUTION | RESPIRATORY_TRACT | 12 refills | Status: AC | PRN

## 2024-02-10 MED ORDER — ACETAMINOPHEN 500 MG PO TABS: 1000.0000 mg | ORAL_TABLET | Freq: Four times a day (QID) | ORAL | Status: AC

## 2024-02-10 MED ORDER — SENNOSIDES-DOCUSATE SODIUM 8.6-50 MG PO TABS: 2.0000 | ORAL_TABLET | Freq: Every day | ORAL | Status: AC

## 2024-02-10 MED ORDER — SIMETHICONE 80 MG PO CHEW: 80.0000 mg | CHEWABLE_TABLET | ORAL | Status: AC | PRN

## 2024-02-10 MED ORDER — WITCH HAZEL-GLYCERIN EX PADS: 1.0000 | MEDICATED_PAD | CUTANEOUS | Status: AC | PRN

## 2024-02-10 MED ORDER — DIBUCAINE (PERIANAL) 1 % EX OINT: 1.0000 | TOPICAL_OINTMENT | CUTANEOUS | Status: AC | PRN

## 2024-02-10 MED ORDER — FERROUS SULFATE 325 (65 FE) MG PO TABS: 325.0000 mg | ORAL_TABLET | Freq: Two times a day (BID) | ORAL | Status: AC

## 2024-02-10 MED ORDER — IBUPROFEN 600 MG PO TABS: 600.0000 mg | ORAL_TABLET | Freq: Four times a day (QID) | ORAL | 0 refills | Status: AC

## 2024-02-10 MED ORDER — BENZOCAINE-MENTHOL 20-0.5 % EX AERO: 1.0000 | INHALATION_SPRAY | CUTANEOUS | Status: AC | PRN

## 2024-02-13 ENCOUNTER — Inpatient Hospital Stay
Admission: AD | Admit: 2024-02-13 | Discharge: 2024-02-15 | DRG: 776 | Disposition: A | Discharge status: Home / Self Care | Source: Ambulatory Visit

## 2024-02-13 ENCOUNTER — Other Ambulatory Visit: Payer: Self-pay | Admitting: Obstetrics

## 2024-02-13 ENCOUNTER — Inpatient Hospital Stay

## 2024-02-13 DIAGNOSIS — O8612 Endometritis following delivery: Secondary | ICD-10-CM | POA: Diagnosis present

## 2024-02-13 DIAGNOSIS — O165 Unspecified maternal hypertension, complicating the puerperium: Secondary | ICD-10-CM

## 2024-02-13 DIAGNOSIS — R109 Unspecified abdominal pain: Secondary | ICD-10-CM | POA: Diagnosis present

## 2024-02-13 DIAGNOSIS — O1415 Severe pre-eclampsia, complicating the puerperium: Secondary | ICD-10-CM | POA: Diagnosis present

## 2024-02-13 LAB — URINALYSIS, COMPLETE (UACMP) WITH MICROSCOPIC
Bilirubin Urine: NEGATIVE
Glucose, UA: NEGATIVE mg/dL
Hgb urine dipstick: NEGATIVE
Ketones, ur: 20 mg/dL — AB
Nitrite: NEGATIVE
Protein, ur: NEGATIVE mg/dL
Specific Gravity, Urine: 1.01 (ref 1.005–1.030)
WBC, UA: >50 WBC/hpf (ref 0–5)
pH: 6 (ref 5.0–8.0)

## 2024-02-13 LAB — COMPREHENSIVE METABOLIC PANEL WITH GFR
ALT: 63 U/L — ABNORMAL HIGH (ref 0–44)
AST: 46 U/L — ABNORMAL HIGH (ref 15–41)
Albumin: 3.5 g/dL (ref 3.5–5.0)
Alkaline Phosphatase: 153 U/L — ABNORMAL HIGH (ref 38–126)
Anion gap: 11 (ref 5–15)
BUN: 6 mg/dL (ref 6–20)
CO2: 24 mmol/L (ref 22–32)
Calcium: 9 mg/dL (ref 8.9–10.3)
Chloride: 105 mmol/L (ref 98–111)
Creatinine, Ser: 0.62 mg/dL (ref 0.44–1.00)
GFR, Estimated: >60 mL/min (ref >60)
Glucose, Bld: 62 mg/dL — ABNORMAL LOW (ref 70–99)
Potassium: 3.5 mmol/L (ref 3.5–5.1)
Sodium: 140 mmol/L (ref 135–145)
Total Bilirubin: 1.4 mg/dL — ABNORMAL HIGH (ref 0.0–1.2)
Total Protein: 7.2 g/dL (ref 6.5–8.1)

## 2024-02-13 LAB — URINALYSIS, ROUTINE W REFLEX MICROSCOPIC
Bacteria, UA: NONE SEEN
Bilirubin Urine: NEGATIVE
Glucose, UA: NEGATIVE mg/dL
Ketones, ur: 20 mg/dL — AB
Nitrite: NEGATIVE
Protein, ur: 100 mg/dL — AB
RBC / HPF: >50 RBC/hpf (ref 0–5)
Specific Gravity, Urine: 1.025 (ref 1.005–1.030)
WBC, UA: >50 WBC/hpf (ref 0–5)
pH: 5 (ref 5.0–8.0)

## 2024-02-13 LAB — CBC
HCT: 34.7 % — ABNORMAL LOW (ref 36.0–46.0)
Hemoglobin: 11.6 g/dL — ABNORMAL LOW (ref 12.0–15.0)
MCH: 30.9 pg (ref 26.0–34.0)
MCHC: 33.4 g/dL (ref 30.0–36.0)
MCV: 92.5 fL (ref 80.0–100.0)
Platelets: 249 K/uL (ref 150–400)
RBC: 3.75 MIL/uL — ABNORMAL LOW (ref 3.87–5.11)
RDW: 13 % (ref 11.5–15.5)
WBC: 15.7 K/uL — ABNORMAL HIGH (ref 4.0–10.5)
nRBC: 0 % (ref 0.0–0.2)

## 2024-02-13 LAB — PROTEIN / CREATININE RATIO, URINE
Creatinine, Urine: 237 mg/dL
Creatinine, Urine: 68 mg/dL
Protein Creatinine Ratio: 0.13 mg/mg{creat} (ref 0.00–0.15)
Protein Creatinine Ratio: 0.85 mg/mg{creat} — ABNORMAL HIGH (ref 0.00–0.15)
Total Protein, Urine: 30 mg/dL
Total Protein, Urine: 58 mg/dL

## 2024-02-13 MED ORDER — ACETAMINOPHEN 325 MG PO TABS
650.0000 mg | ORAL_TABLET | Freq: Four times a day (QID) | ORAL | Status: DC | PRN
  Administered 2024-02-13: 650 mg via ORAL
  Filled 2024-02-13: qty 2

## 2024-02-13 MED ORDER — COMPLETENATE 29-1 MG PO CHEW
1.0000 | CHEWABLE_TABLET | Freq: Every day | ORAL | Status: DC
  Filled 2024-02-13 (×3): qty 1

## 2024-02-13 MED ORDER — NIFEDIPINE ER OSMOTIC RELEASE 30 MG PO TB24
30.0000 mg | ORAL_TABLET | Freq: Every day | ORAL | Status: DC
  Administered 2024-02-13: 30 mg via ORAL
  Filled 2024-02-13: qty 1

## 2024-02-13 MED ORDER — LABETALOL HCL 5 MG/ML IV SOLN: 80.0000 mg | INTRAVENOUS | Status: DC | PRN

## 2024-02-13 MED ORDER — LACTATED RINGERS IV SOLN: INTRAVENOUS | Status: DC

## 2024-02-13 MED ORDER — OXYCODONE HCL 5 MG PO TABS
5.0000 mg | ORAL_TABLET | ORAL | Status: DC | PRN
  Administered 2024-02-13 (×2): 5 mg via ORAL
  Filled 2024-02-13 (×2): qty 1

## 2024-02-13 MED ORDER — MAGNESIUM SULFATE BOLUS VIA INFUSION
4.0000 g | Freq: Once | INTRAVENOUS | Status: AC
  Administered 2024-02-13: 4 g via INTRAVENOUS
  Filled 2024-02-13: qty 1000

## 2024-02-13 MED ORDER — CLINDAMYCIN PHOSPHATE 900 MG/50ML IV SOLN
900.0000 mg | Freq: Three times a day (TID) | INTRAVENOUS | Status: DC
  Administered 2024-02-13 – 2024-02-15 (×7): 900 mg via INTRAVENOUS
  Filled 2024-02-13 (×8): qty 50

## 2024-02-13 MED ORDER — CALCIUM GLUCONATE 10 % IV SOLN
INTRAVENOUS | Status: AC
  Filled 2024-02-13: qty 10

## 2024-02-13 MED ORDER — IBUPROFEN 600 MG PO TABS
600.0000 mg | ORAL_TABLET | Freq: Four times a day (QID) | ORAL | Status: DC | PRN
  Administered 2024-02-13 – 2024-02-14 (×3): 600 mg via ORAL
  Filled 2024-02-13 (×3): qty 1

## 2024-02-13 MED ORDER — SENNOSIDES-DOCUSATE SODIUM 8.6-50 MG PO TABS
1.0000 | ORAL_TABLET | Freq: Every day | ORAL | Status: DC
  Administered 2024-02-14: 1 via ORAL
  Filled 2024-02-13: qty 1

## 2024-02-13 MED ORDER — HYDRALAZINE HCL 20 MG/ML IJ SOLN: 10.0000 mg | INTRAMUSCULAR | Status: DC | PRN

## 2024-02-13 MED ORDER — GENTAMICIN SULFATE 40 MG/ML IJ SOLN
5.0000 mg/kg | INTRAVENOUS | Status: DC
  Administered 2024-02-14 – 2024-02-15 (×2): 360 mg via INTRAVENOUS
  Filled 2024-02-13 (×2): qty 9

## 2024-02-13 MED ORDER — OXYCODONE-ACETAMINOPHEN 5-325 MG PO TABS
1.0000 | ORAL_TABLET | Freq: Four times a day (QID) | ORAL | Status: DC | PRN
  Administered 2024-02-14: 1 via ORAL
  Filled 2024-02-13: qty 2

## 2024-02-13 MED ORDER — LABETALOL HCL 5 MG/ML IV SOLN: 40.0000 mg | INTRAVENOUS | Status: DC | PRN

## 2024-02-13 MED ORDER — LABETALOL HCL 5 MG/ML IV SOLN: 20.0000 mg | INTRAVENOUS | Status: DC | PRN

## 2024-02-13 MED ORDER — FERROUS SULFATE 325 (65 FE) MG PO TABS
325.0000 mg | ORAL_TABLET | Freq: Two times a day (BID) | ORAL | Status: DC
  Administered 2024-02-14 – 2024-02-15 (×3): 325 mg via ORAL
  Filled 2024-02-13 (×3): qty 1

## 2024-02-13 MED ORDER — NIFEDIPINE ER OSMOTIC RELEASE 30 MG PO TB24
60.0000 mg | ORAL_TABLET | Freq: Every day | ORAL | Status: DC
  Administered 2024-02-14: 60 mg via ORAL
  Filled 2024-02-13: qty 2

## 2024-02-13 MED ORDER — COCONUT OIL OIL
1.0000 | TOPICAL_OIL | Status: DC | PRN
  Filled 2024-02-13: qty 7.5

## 2024-02-13 MED ORDER — MAGNESIUM SULFATE 40 GM/1000ML IV SOLN
2.0000 g/h | INTRAVENOUS | Status: DC
  Administered 2024-02-13: 2 g/h via INTRAVENOUS
  Filled 2024-02-13: qty 1000

## 2024-02-13 MED ORDER — CLINDAMYCIN PHOSPHATE 900 MG/50ML IV SOLN
900.0000 mg | Freq: Three times a day (TID) | INTRAVENOUS | Status: DC
  Filled 2024-02-13: qty 50

## 2024-02-13 MED ORDER — GENTAMICIN SULFATE 40 MG/ML IJ SOLN
5.0000 mg/kg | INTRAVENOUS | Status: DC
  Administered 2024-02-13: 360 mg via INTRAVENOUS
  Filled 2024-02-13: qty 9

## 2024-02-13 MED ORDER — SIMETHICONE 80 MG PO CHEW: 80.0000 mg | CHEWABLE_TABLET | ORAL | Status: DC | PRN

## 2024-02-13 MED ORDER — NIFEDIPINE ER OSMOTIC RELEASE 30 MG PO TB24
30.0000 mg | ORAL_TABLET | Freq: Once | ORAL | Status: AC
  Administered 2024-02-13: 30 mg via ORAL
  Filled 2024-02-13: qty 1

## 2024-02-13 NOTE — Lactation Note (Signed)
 Lactation Consultation Note  Patient Name: Colleen Steele Unijb'd Date: 02/13/2024 Age:23 y.o. Reason for consult: Initial assessment;Exclusive pumping and bottle feeding;Engorgement;RN request   Maternal Data   4 day postpartum patient admission to unit for endometritis upon delivery, she is exclusively pumping and was referred to lactation by RN to assess pumping. She is using Medela symphony DEBP with 21mm flange, and reports pain related to engorgement of breast tissue upon LC room entry. Pt states she is pumping around the clock and removing milk to comfort, with sessions typical of 20-30 minute duration with DEBP. Presently she reports no nipple pain but is becoming severely engorged in between pumping sessions. RN requesting assist upon setting up breast pump with milk backing up onto patient.   Lactation Tools Discussed/Used  LC assessment included the following: -Removal of colostrum collectors from flange/valve piece as copious mature milk output present -Severe engorgement of breasts, with substantial softening visible upon milk removal with pump - collected from both breasts during approx 15 minutes. CDC milk storage guidelines reviewed and provided along with pumping diary, LC outpatient services brochure, and education regarding exlusive pumping and oversupply/engorgement prevention and management. -Ice packs and cleaning kit provided to patient to reduce engorgement of breasts and maintain sanitary handling of breastmilk while inpatient. Interventions Interventions: Breast compression;Expressed milk;DEBP;8 oz. bottles;Ice;Education;LC Services brochure;Guidelines for Milk Supply and Pumping Schedule Handout;CDC milk storage guidelines;CDC Guidelines for Breast Pump Cleaning LC recommended that patient utilize ice packs upon completing pumping sessions, with adjustment to length of each session gradually reduced to maximum of 15 minutes when tolerated. Education regarding  feedback loop of lactation reviewed as to discourage induction of milk oversupply. LC also recommended that sessions be completed on a scheduled routine basis when feasible, ideally 8x a day or to mimic infant feeding patterns and regulate output accordingly. Encouraged patient to contact floor LC while admitted and visit outpatient clinic if engorgement persists or any changes occur in production or condition of breast tissue. If conditions worsen or are accompanied by fever, flu-like symptoms, or severe pain from engorgement, contact primary care provider immediately. Discharge Discharge Education: Engorgement and breast care;Outpatient recommendation  Consult Status Consult Status: PRN    [name] 02/13/2024, 3:39 PM

## 2024-02-13 NOTE — H&P (Signed)
 History of Present Illness:   Chief Complaint: severe abdominal pain  HPI:  Colleen Steele is a 23 y.o. [phone] female at 4 days postpartum who presented to clinic with complaints of severe abdominal pain, elevated blood pressure and a HA.  She had an uncomplicated spontaneous vaginal delivery on 02/09/24.  [name] was discharged home in stable condition on 02/10/24.  Decision made to admit for postpartum endometritis.    Prenatal care site:  [facility]  Factors complicating antepartum care:  Patient Active Problem List   Diagnosis Date Noted   Endometritis following delivery 02/13/2024   Delayed delivery after spontaneous rupture of membranes 02/09/2024   Delayed delivery after SROM (spontaneous rupture of membranes) 02/08/2024   Positive GBS test 02/08/2024   Marijuana use during pregnancy 02/08/2024   Late prenatal care 02/08/2024   Limited prenatal care in third trimester 02/08/2024   Elevated glucose tolerance test 02/08/2024   Supervision of high risk pregnancy in third trimester 08/14/2023   Chronic hypertension in pregnancy 10/20/2021     Maternal Medical History:   Past Medical History:  Diagnosis Date   Asthma    Chlamydia    Chronic kidney disease    Diarrhea    Environmental allergies    Gonorrhea    Hypertension    Trichomonal infection 04/20/2018   Treated with Flagyl  x 2gm; negative TOC on 05/22/18     Vomiting     Past Surgical History:  Procedure Laterality Date   MOUTH SURGERY     NO PAST SURGERIES      No Known Allergies  Prior to Admission medications   Medication Sig Start Date End Date Taking? Authorizing Provider  acetaminophen  (TYLENOL ) 500 MG tablet Take 2 tablets (1,000 mg total) by mouth every 6 (six) hours. 02/10/24   Jeanette Rauth, CNM  albuterol  (PROVENTIL ) (2.5 MG/3ML) 0.083% nebulizer solution Inhale 3 mLs into the lungs every 4 (four) hours as needed for wheezing or shortness of breath. 02/10/24   Nalayah Hitt,  Taiquan Campanaro, CNM  benzocaine -Menthol  (DERMOPLAST) 20-0.5 % AERO Apply 1 Application topically as needed for irritation (perineal discomfort). 02/10/24   Lamara Brecht, CNM  coconut oil OIL Apply 1 Application topically as needed. 02/10/24   Jamonte Curfman, CNM  dibucaine (NUPERCAINAL) 1 % OINT Place 1 Application rectally as needed for hemorrhoids. 02/10/24   Aisha Heller, CNM  ferrous sulfate  325 (65 FE) MG tablet Take 1 tablet (325 mg total) by mouth 2 (two) times daily with a meal. 02/10/24   Aisha Heller, CNM  ibuprofen  (ADVIL ) 600 MG tablet Take 1 tablet (600 mg total) by mouth every 6 (six) hours. 02/10/24   Cassaundra Rasch, CNM  Prenatal Vit-Fe Fumarate-FA (MULTIVITAMIN-PRENATAL) 27-0.8 MG TABS tablet Take 1 tablet by mouth daily at 12 noon. Patient not taking: Reported on 02/08/2024    [provider]  senna-docusate (SENOKOT-S) 8.6-50 MG tablet Take 2 tablets by mouth daily. 02/11/24   Reia Viernes, CNM  simethicone  (MYLICON) 80 MG chewable tablet Chew 1 tablet (80 mg total) by mouth as needed for flatulence. 02/10/24   Aisha Heller, CNM  witch hazel-glycerin  (TUCKS) pad Apply 1 Application topically as needed for hemorrhoids. 02/10/24   Aisha Heller, CNM    OB History  Gravida Para Term Preterm AB Living  4 3 3  0 1 3  SAB IAB Ectopic Multiple Live Births  0 0 0 0 3    # Outcome Date GA Lbr Len/2nd Weight Sex Type Anes PTL Lv  4 Term 02/09/24 [redacted]w[redacted]d  04:12 / 00:05 3240 g M Vag-Spont None  LIV     Name: Boy Colleen Steele     Apgar1: 8  Apgar5: 9  3 Term 10/21/21 [redacted]w[redacted]d  3221 g M Vag-Spont None N LIV     Name: Colleen Steele  2 Term 09/05/18 [redacted]w[redacted]d / 00:01 3125 g F Vag-Spont None  LIV     Name: Colleen Steele     Apgar1: 9  Apgar5: 9  1 AB              Social History: She  reports that she has never smoked. She has never used smokeless tobacco. She reports that she does not currently use alcohol. She reports current drug use. Drug:  Marijuana.  Family History: family history includes Diverticulitis in her sister; Hypertension in her mother.   Review of Systems: A full review of systems was performed and negative except as noted in the HPI.     Physical Exam:  Vital Signs: BP (!) 143/82 (BP Location: Left Arm)   Pulse 66   Temp 98.9 F (37.2 C) (Oral)   Resp 18   LMP 05/09/2023 (Approximate)   SpO2 100%  Physical Exam  General: no acute distress.  HEENT: normocephalic, atraumatic Heart: regular rate & rhythm.  No murmurs/rubs/gallops Lungs: clear to auscultation bilaterally, normal respiratory effort Abdomen: soft, gravid, tender; fundus firm, U/1 Pelvic: deferred   Extremities: non-tender, symmetric,  edema bilaterally.  DTRs: +2  Neurologic: Alert & oriented x 3.    Results for orders placed or performed during the hospital encounter of 02/13/24 (from the past 24 hours)  CBC     Status: Abnormal   Collection Time: 02/13/24  1:01 PM  Result Value Ref Range   WBC 15.7 (H) 4.0 - 10.5 K/uL   RBC 3.75 (L) 3.87 - 5.11 MIL/uL   Hemoglobin 11.6 (L) 12.0 - 15.0 g/dL   HCT 65.2 (L) 63.9 - 53.9 %   MCV 92.5 80.0 - 100.0 fL   MCH 30.9 26.0 - 34.0 pg   MCHC 33.4 30.0 - 36.0 g/dL   RDW 86.9 88.4 - 84.4 %   Platelets 249 150 - 400 K/uL   nRBC 0.0 0.0 - 0.2 %  Urinalysis, Routine w reflex microscopic -Urine, Clean Catch     Status: Abnormal   Collection Time: 02/13/24  1:01 PM  Result Value Ref Range   Color, Urine YELLOW (A) YELLOW   APPearance CLOUDY (A) CLEAR   Specific Gravity, Urine 1.025 1.005 - 1.030   pH 5.0 5.0 - 8.0   Glucose, UA NEGATIVE NEGATIVE mg/dL   Hgb urine dipstick LARGE (A) NEGATIVE   Bilirubin Urine NEGATIVE NEGATIVE   Ketones, ur 20 (A) NEGATIVE mg/dL   Protein, ur 899 (A) NEGATIVE mg/dL   Nitrite NEGATIVE NEGATIVE   Leukocytes,Ua LARGE (A) NEGATIVE   RBC / HPF >50 0 - 5 RBC/hpf   WBC, UA >50 0 - 5 WBC/hpf   Bacteria, UA NONE SEEN NONE SEEN   Squamous Epithelial / HPF 6-10 0 - 5  /HPF   Mucus PRESENT   Comprehensive metabolic panel     Status: Abnormal   Collection Time: 02/13/24  1:01 PM  Result Value Ref Range   Sodium 140 135 - 145 mmol/L   Potassium 3.5 3.5 - 5.1 mmol/L   Chloride 105 98 - 111 mmol/L   CO2 24 22 - 32 mmol/L   Glucose, Bld 62 (L) 70 - 99 mg/dL   BUN 6 6 -  20 mg/dL   Creatinine, Ser 9.37 0.44 - 1.00 mg/dL   Calcium  9.0 8.9 - 10.3 mg/dL   Total Protein 7.2 6.5 - 8.1 g/dL   Albumin 3.5 3.5 - 5.0 g/dL   AST 46 (H) 15 - 41 U/L   ALT 63 (H) 0 - 44 U/L   Alkaline Phosphatase 153 (H) 38 - 126 U/L   Total Bilirubin 1.4 (H) 0.0 - 1.2 mg/dL   GFR, Estimated >39 >39 mL/min   Anion gap 11 5 - 15  Protein / creatinine ratio, urine     Status: None   Collection Time: 02/13/24  1:01 PM  Result Value Ref Range   Creatinine, Urine 237 mg/dL   Total Protein, Urine 30 mg/dL   Protein Creatinine Ratio 0.13 0.00 - 0.15 mg/mg[Cre]    Pertinent Results:   Blood type/Rh O POS   Antibody screen Negative    Rubella Immune    Varicella Immune  RPR NR    HBsAg Neg   Hep C NR   HIV Neg    GC neg  Chlamydia neg  Genetic screening cfDNA negative   1 hour GTT 155  3 hour GTT Not done - Random glucose on admission 70, A1C 4.5  GBS Pos      Recent Results (from the past 2160 hours)  OB RESULTS CONSOLE HIV antibody     Status: None   Collection Time: 11/28/23 12:00 AM  Result Value Ref Range   HIV Non-reactive   Basic metabolic panel     Status: Abnormal   Collection Time: 01/05/24  9:33 AM  Result Value Ref Range   Sodium 135 135 - 145 mmol/L   Potassium 3.4 (L) 3.5 - 5.1 mmol/L   Chloride 105 98 - 111 mmol/L   CO2 20 (L) 22 - 32 mmol/L   Glucose, Bld 97 70 - 99 mg/dL    Comment: Glucose reference range applies only to samples taken after fasting for at least 8 hours.   BUN 5 (L) 6 - 20 mg/dL   Creatinine, Ser 9.43 0.44 - 1.00 mg/dL   Calcium  8.4 (L) 8.9 - 10.3 mg/dL   GFR, Estimated >39 >39 mL/min    Comment: (NOTE) Calculated using the  CKD-EPI Creatinine Equation (2021)    Anion gap 10 5 - 15    Comment: Performed at [facility], [address] [place], [place] [address]  CBC     Status: Abnormal   Collection Time: 01/05/24  9:33 AM  Result Value Ref Range   WBC 9.3 4.0 - 10.5 K/uL   RBC 3.45 (L) 3.87 - 5.11 MIL/uL   Hemoglobin 11.0 (L) 12.0 - 15.0 g/dL   HCT 68.0 (L) 63.9 - 53.9 %   MCV 92.5 80.0 - 100.0 fL   MCH 31.9 26.0 - 34.0 pg   MCHC 34.5 30.0 - 36.0 g/dL   RDW 87.2 88.4 - 84.4 %   Platelets 202 150 - 400 K/uL   nRBC 0.0 0.0 - 0.2 %    Comment: Performed at [facility], [address] [place], [place] [address]  Troponin I (High Sensitivity)     Status: None   Collection Time: 01/05/24  9:33 AM  Result Value Ref Range   Troponin I (High Sensitivity) 6 <18 ng/L    Comment: (NOTE) Elevated high sensitivity troponin I (hsTnI) values and significant  changes across serial measurements may suggest ACS but many other  chronic and acute conditions are known to  elevate hsTnI results.  Refer to the Links section for chest pain algorithms and additional  guidance. Performed at [facility], [address] [place], [place] [address]   D-dimer, quantitative     Status: Abnormal   Collection Time: 01/05/24  9:33 AM  Result Value Ref Range   D-Dimer, Quant 1.60 (H) 0.00 - 0.50 ug/mL-FEU    Comment: (NOTE) At the manufacturer cut-off value of 0.5 g/mL FEU, this assay has a negative predictive value of 95-100%.This assay is intended for use in conjunction with a clinical pretest probability (PTP) assessment model to exclude pulmonary embolism (PE) and deep venous thrombosis (DVT) in outpatients suspected of PE or DVT. Results should be correlated with clinical presentation. Performed at [facility], [address] [place], [place] [address]   Brain natriuretic peptide     Status: None   Collection Time: 01/05/24  9:33 AM  Result Value Ref Range   B Natriuretic  Peptide 23.1 0.0 - 100.0 pg/mL    Comment: Performed at [facility], [address] [place], [place] [address]  Hepatic function panel     Status: Abnormal   Collection Time: 01/05/24  9:33 AM  Result Value Ref Range   Total Protein 6.5 6.5 - 8.1 g/dL   Albumin 3.2 (L) 3.5 - 5.0 g/dL   AST 25 15 - 41 U/L   ALT 19 0 - 44 U/L   Alkaline Phosphatase 106 38 - 126 U/L   Total Bilirubin 1.4 (H) 0.0 - 1.2 mg/dL   Bilirubin, Direct 0.1 0.0 - 0.2 mg/dL   Indirect Bilirubin 1.3 (H) 0.3 - 0.9 mg/dL    Comment: Performed at [facility], [address] [place], [place] [address]  Troponin I (High Sensitivity)     Status: None   Collection Time: 01/05/24 12:27 PM  Result Value Ref Range   Troponin I (High Sensitivity) 6 <18 ng/L    Comment: (NOTE) Elevated high sensitivity troponin I (hsTnI) values and significant  changes across serial measurements may suggest ACS but many other  chronic and acute conditions are known to elevate hsTnI results.  Refer to the Links section for chest pain algorithms and additional  guidance. Performed at [facility], [address] [place], [place] [address]   Urinalysis, Routine w reflex microscopic -Urine, Clean Catch     Status: Abnormal   Collection Time: 01/05/24  2:14 PM  Result Value Ref Range   Color, Urine YELLOW (A) YELLOW   APPearance HAZY (A) CLEAR   Specific Gravity, Urine 1.017 1.005 - 1.030   pH 6.0 5.0 - 8.0   Glucose, UA NEGATIVE NEGATIVE mg/dL   Hgb urine dipstick NEGATIVE NEGATIVE   Bilirubin Urine NEGATIVE NEGATIVE   Ketones, ur NEGATIVE NEGATIVE mg/dL   Protein, ur NEGATIVE NEGATIVE mg/dL   Nitrite NEGATIVE NEGATIVE   Leukocytes,Ua MODERATE (A) NEGATIVE   RBC / HPF 6-10 0 - 5 RBC/hpf   WBC, UA 11-20 0 - 5 WBC/hpf   Bacteria, UA RARE (A) NONE SEEN   Squamous Epithelial / HPF 0-5 0 - 5 /HPF   Mucus PRESENT    Amorphous Crystal PRESENT     Comment: Performed at [facility], [address] [place], [place] [address]  Protein / creatinine ratio, urine     Status: None   Collection Time: 01/05/24  2:14 PM  Result Value Ref Range   Creatinine, Urine 131 mg/dL   Total Protein, Urine 9 mg/dL  Comment: NO NORMAL RANGE ESTABLISHED FOR THIS TEST   Protein Creatinine Ratio 0.07 0.00 - 0.15 mg/mg[Cre]    Comment: Performed at [facility], [address] [place], [place] [address]  Urine Culture     Status: Abnormal   Collection Time: 01/05/24  2:14 PM   Specimen: Urine, Clean Catch  Result Value Ref Range   Specimen Description      URINE, CLEAN CATCH Performed at [facility], [address] [place], [place] [address]    Special Requests      NONE Performed at [facility], [address] [place], [place] [address]    Culture >=100,000 COLONIES/mL STAPHYLOCOCCUS SAPROPHYTICUS (A)    Report Status 01/07/2024 FINAL    Organism ID, Bacteria STAPHYLOCOCCUS SAPROPHYTICUS (A)       Susceptibility   Staphylococcus saprophyticus - MIC*    CIPROFLOXACIN <=0.5 SENSITIVE Sensitive     GENTAMICIN  <=0.5 SENSITIVE Sensitive     NITROFURANTOIN <=16 SENSITIVE Sensitive     OXACILLIN >=4 RESISTANT Resistant     TETRACYCLINE <=1 SENSITIVE Sensitive     VANCOMYCIN <=0.5 SENSITIVE Sensitive     TRIMETH/SULFA <=10 SENSITIVE Sensitive     RIFAMPIN <=0.5 SENSITIVE Sensitive     Inducible Clindamycin  NEGATIVE Sensitive     * >=100,000 COLONIES/mL STAPHYLOCOCCUS SAPROPHYTICUS  OB RESULT CONSOLE Group B Strep     Status: None   Collection Time: 02/01/24 12:00 AM  Result Value Ref Range   GBS Positive   OB RESULTS CONSOLE GC/Chlamydia     Status: None   Collection Time: 02/01/24 12:00 AM  Result Value Ref Range   Chlamydia Negative    Neisseria Gonorrhea Negative   CBC     Status: Abnormal   Collection Time: 02/08/24 12:52 PM  Result Value Ref Range   WBC 10.1 4.0 - 10.5 K/uL   RBC 3.55 (L) 3.87 - 5.11 MIL/uL   Hemoglobin 10.9 (L) 12.0 - 15.0 g/dL    HCT 68.2 (L) 63.9 - 46.0 %   MCV 89.3 80.0 - 100.0 fL   MCH 30.7 26.0 - 34.0 pg   MCHC 34.4 30.0 - 36.0 g/dL   RDW 87.4 88.4 - 84.4 %   Platelets 206 150 - 400 K/uL   nRBC 0.0 0.0 - 0.2 %    Comment: Performed at [facility], [address] [place], [place] [address]  Type and screen [facility]     Status: None   Collection Time: 02/08/24 12:52 PM  Result Value Ref Range   ABO/RH(D) O POS    Antibody Screen NEG    Sample Expiration      02/11/2024,2359 Performed at [facility], [address] [place], [place] [address]   RPR     Status: None   Collection Time: 02/08/24 12:52 PM  Result Value Ref Range   RPR Ser Ql NON REACTIVE NON REACTIVE    Comment: Performed at [facility], [address] [place], [place] [address]  Comprehensive metabolic panel with GFR     Status: Abnormal   Collection Time: 02/08/24  1:07 PM  Result Value Ref Range   Sodium 136 135 - 145 mmol/L   Potassium 3.8 3.5 - 5.1 mmol/L   Chloride 103 98 - 111 mmol/L   CO2 20 (L) 22 - 32 mmol/L   Glucose, Bld 70 70 - 99 mg/dL    Comment: Glucose reference range applies only to samples taken after fasting for  at least 8 hours.   BUN 6 6 - 20 mg/dL   Creatinine, Ser 9.43 0.44 - 1.00 mg/dL   Calcium  8.8 (L) 8.9 - 10.3 mg/dL   Total Protein 6.0 (L) 6.5 - 8.1 g/dL   Albumin 2.9 (L) 3.5 - 5.0 g/dL   AST 20 15 - 41 U/L   ALT 12 0 - 44 U/L   Alkaline Phosphatase 192 (H) 38 - 126 U/L   Total Bilirubin 1.3 (H) 0.0 - 1.2 mg/dL   GFR, Estimated >39 >39 mL/min    Comment: (NOTE) Calculated using the CKD-EPI Creatinine Equation (2021)    Anion gap 13 5 - 15    Comment: Performed at [facility], [address] [place], [place] [address]  Hemoglobin A1c     Status: Abnormal   Collection Time: 02/08/24  1:07 PM  Result Value Ref Range   Hgb A1c MFr Bld 4.5 (L) 4.8 - 5.6 %    Comment: (NOTE) Diagnosis of Diabetes The following HbA1c ranges recommended  by the American Diabetes Association (ADA) may be used as an aid in the diagnosis of diabetes mellitus.  Hemoglobin             Suggested A1C NGSP%              Diagnosis  <5.7                   Non Diabetic  5.7-6.4                Pre-Diabetic  >6.4                   Diabetic  <7.0                   Glycemic control for                       adults with diabetes.     Mean Plasma Glucose 82.45 mg/dL    Comment: Performed at [facility], [address] [place], [place] [address]  Protein / creatinine ratio, urine     Status: Abnormal   Collection Time: 02/08/24  2:26 PM  Result Value Ref Range   Creatinine, Urine 56 mg/dL   Total Protein, Urine 17 mg/dL    Comment: NO NORMAL RANGE ESTABLISHED FOR THIS TEST   Protein Creatinine Ratio 0.30 (H) 0.00 - 0.15 mg/mg[Cre]    Comment: Performed at [facility], [address], [place], [place] [address]  Urine Drug Screen, Qualitative ([facility] only)     Status: Abnormal   Collection Time: 02/08/24  2:26 PM  Result Value Ref Range   Tricyclic, Ur Screen NONE DETECTED NONE DETECTED   Amphetamines, Ur Screen NONE DETECTED NONE DETECTED   MDMA (Ecstasy)Ur Screen NONE DETECTED NONE DETECTED   Cocaine Metabolite,Ur [place] NONE DETECTED NONE DETECTED   Opiate, Ur Screen NONE DETECTED NONE DETECTED   Phencyclidine (PCP) Ur S NONE DETECTED NONE DETECTED   Cannabinoid 50 Ng, Ur [place] POSITIVE (A) NONE DETECTED   Barbiturates, Ur Screen NONE DETECTED NONE DETECTED   Benzodiazepine, Ur Scrn NONE DETECTED NONE DETECTED   Methadone Scn, Ur NONE DETECTED NONE DETECTED    Comment: (NOTE) Tricyclics + metabolites, urine    Cutoff 1000 ng/mL Amphetamines + metabolites, urine  Cutoff 1000 ng/mL MDMA (Ecstasy), urine              Cutoff 500 ng/mL Cocaine Metabolite,  urine          Cutoff 300 ng/mL Opiate + metabolites, urine        Cutoff 300 ng/mL Phencyclidine (PCP), urine         Cutoff 25 ng/mL Cannabinoid, urine                 Cutoff  50 ng/mL Barbiturates + metabolites, urine  Cutoff 200 ng/mL Benzodiazepine, urine              Cutoff 200 ng/mL Methadone, urine                   Cutoff 300 ng/mL  The urine drug screen provides only a preliminary, unconfirmed analytical test result and should not be used for non-medical purposes. Clinical consideration and professional judgment should be applied to any positive drug screen result due to possible interfering substances. A more specific alternate chemical method must be used in order to obtain a confirmed analytical result. Gas chromatography / mass spectrometry (GC/MS) is the preferred confirm atory method. Performed at [facility], [address] [place], [place] [address]   CBC     Status: Abnormal   Collection Time: 02/09/24  5:08 AM  Result Value Ref Range   WBC 11.8 (H) 4.0 - 10.5 K/uL   RBC 3.17 (L) 3.87 - 5.11 MIL/uL   Hemoglobin 9.9 (L) 12.0 - 15.0 g/dL   HCT 71.3 (L) 63.9 - 53.9 %   MCV 90.2 80.0 - 100.0 fL   MCH 31.2 26.0 - 34.0 pg   MCHC 34.6 30.0 - 36.0 g/dL   RDW 87.5 88.4 - 84.4 %   Platelets 188 150 - 400 K/uL   nRBC 0.0 0.0 - 0.2 %    Comment: Performed at [facility], [address] [place], [place] [address]  CBC     Status: Abnormal   Collection Time: 02/13/24  1:01 PM  Result Value Ref Range   WBC 15.7 (H) 4.0 - 10.5 K/uL   RBC 3.75 (L) 3.87 - 5.11 MIL/uL   Hemoglobin 11.6 (L) 12.0 - 15.0 g/dL   HCT 65.2 (L) 63.9 - 53.9 %   MCV 92.5 80.0 - 100.0 fL   MCH 30.9 26.0 - 34.0 pg   MCHC 33.4 30.0 - 36.0 g/dL   RDW 86.9 88.4 - 84.4 %   Platelets 249 150 - 400 K/uL   nRBC 0.0 0.0 - 0.2 %    Comment: Performed at [facility], [address] [place], [place] [address]  Urinalysis, Routine w reflex microscopic -Urine, Clean Catch     Status: Abnormal   Collection Time: 02/13/24  1:01 PM  Result Value Ref Range   Color, Urine YELLOW (A) YELLOW   APPearance CLOUDY (A) CLEAR   Specific Gravity, Urine 1.025  1.005 - 1.030   pH 5.0 5.0 - 8.0   Glucose, UA NEGATIVE NEGATIVE mg/dL   Hgb urine dipstick LARGE (A) NEGATIVE   Bilirubin Urine NEGATIVE NEGATIVE   Ketones, ur 20 (A) NEGATIVE mg/dL   Protein, ur 899 (A) NEGATIVE mg/dL   Nitrite NEGATIVE NEGATIVE   Leukocytes,Ua LARGE (A) NEGATIVE   RBC / HPF >50 0 - 5 RBC/hpf   WBC, UA >50 0 - 5 WBC/hpf   Bacteria, UA NONE SEEN NONE SEEN   Squamous Epithelial / HPF 6-10 0 - 5 /HPF   Mucus PRESENT     Comment: Performed at [facility], [address], [place], [place]  [address]  Comprehensive metabolic panel     Status: Abnormal   Collection Time: 02/13/24  1:01 PM  Result Value Ref Range   Sodium 140 135 - 145 mmol/L   Potassium 3.5 3.5 - 5.1 mmol/L   Chloride 105 98 - 111 mmol/L   CO2 24 22 - 32 mmol/L   Glucose, Bld 62 (L) 70 - 99 mg/dL    Comment: Glucose reference range applies only to samples taken after fasting for at least 8 hours.   BUN 6 6 - 20 mg/dL   Creatinine, Ser 9.37 0.44 - 1.00 mg/dL   Calcium  9.0 8.9 - 10.3 mg/dL   Total Protein 7.2 6.5 - 8.1 g/dL   Albumin 3.5 3.5 - 5.0 g/dL   AST 46 (H) 15 - 41 U/L   ALT 63 (H) 0 - 44 U/L   Alkaline Phosphatase 153 (H) 38 - 126 U/L   Total Bilirubin 1.4 (H) 0.0 - 1.2 mg/dL   GFR, Estimated >39 >39 mL/min    Comment: (NOTE) Calculated using the CKD-EPI Creatinine Equation (2021)    Anion gap 11 5 - 15    Comment: Performed at [facility], [address] [place], [place] [address]  Protein / creatinine ratio, urine     Status: None   Collection Time: 02/13/24  1:01 PM  Result Value Ref Range   Creatinine, Urine 237 mg/dL   Total Protein, Urine 30 mg/dL    Comment: NO NORMAL RANGE ESTABLISHED FOR THIS TEST   Protein Creatinine Ratio 0.13 0.00 - 0.15 mg/mg[Cre]    Comment: Performed at [facility] [address] [place], [place] [address]     Assessment:  Zenora Karpel is a 23 y.o. [phone] female with postpartum preeclampsia with severe  features.   Plan:  1. Admit to Labor & Delivery; consents reviewed and obtained - Discussed plan of care with Dr Flo  2. Routine postpartum care  - Lactation consult PRN breastfeeding  - Continue routine postpartum medications  - Vital signs q 4 hours   3. Postpartum Endometritis  - Tmax 98.9 - IV fluids  - Abx therapy with Gentamicin  and clindamycin   - Tylenol  PRN pain/fever  -Ibuprofen  PRN -Oxycodone  PRN - UA and urine culture ordered  - Vaginal cultures collected  -Pelvic Ultrasound ordered - CBC and CMP collected  -PIH labs ordered -Procardia  xl 30 mg daily  Merrianne Mccumbers, CNM 02/13/24 3:42 PM  Undrea Shipes, CNM Certified Nurse Midwife [facility]

## 2024-02-13 NOTE — Progress Notes (Signed)
 Patient is a direct admit from clinic with s/s of endometritis and elevated Bp  4 days postpartum.  Bobbette Brunswick CNM

## 2024-02-13 NOTE — Progress Notes (Addendum)
 Postpartum Day  4  Subjective: 23 y.o. H5E6986 postpartum day #4 status post normal spontaneous vaginal delivery. She is ambulating, is tolerating po, is voiding spontaneously.  Her pain is somewhat controlled on PO pain medications. Her lochia is less than menses.  Objective: BP (!) 140/83   Pulse (!) 117   Temp 99 F (37.2 C) (Oral)   Resp 20   LMP 05/09/2023 (Approximate)   SpO2 100%    Physical Exam:  General: alert, cooperative, and appears stated age Breasts: soft/nontender producing adequate milk Pulm: nl effort Abdomen: soft, tender, active bowel sounds Uterine Fundus: firm Perineum: minimal edema, intact Lochia: appropriate   Recent Labs    02/13/24 1301  HGB 11.6*  HCT 34.7*  WBC 15.7*  PLT 249    Assessment/Plan: 23 y.o. H5E6986 Postpartum Day  4  1. Continue routine postpartum care  2. Infant feeding status: breast feeding --Lactation consult PRN for breastfeeding   3. Preeclampsia- Start Magnesium  4 gm bolus, 2g/hr x 12 hours -strict I's&O's -regular diet -SCD's -bedside commode -vitals Q 1 hr -Mag check Q 1 hr -conditional labetalol  orders -Procardia  increased to 60 mg daily -LR 75 ml/hr -seizure precautions  4. Endometritis  -IV Clindamycin  900 mg Q 8 hrs -IV Gentamicin  360 mg Q 24 hr -Repeat CBC, CMP in AM -Percocet PRN Q 6 hours -Ibuprofen  PRN Q 6 hours -Vaginal Cultures pending  Disposition: continue inpatient postpartum care    LOS: 0 days   Colleen Steele, CNM 02/13/2024, 7:38 PM   ----- Colleen Steele Certified Nurse Midwife [facility]

## 2024-02-14 LAB — COMPREHENSIVE METABOLIC PANEL WITH GFR
ALT: 45 U/L — ABNORMAL HIGH (ref 0–44)
AST: 27 U/L (ref 15–41)
Albumin: 2.8 g/dL — ABNORMAL LOW (ref 3.5–5.0)
Alkaline Phosphatase: 125 U/L (ref 38–126)
Anion gap: 10 (ref 5–15)
BUN: <5 mg/dL — ABNORMAL LOW (ref 6–20)
CO2: 24 mmol/L (ref 22–32)
Calcium: 7 mg/dL — ABNORMAL LOW (ref 8.9–10.3)
Chloride: 103 mmol/L (ref 98–111)
Creatinine, Ser: 0.46 mg/dL (ref 0.44–1.00)
GFR, Estimated: >60 mL/min (ref >60)
Glucose, Bld: 102 mg/dL — ABNORMAL HIGH (ref 70–99)
Potassium: 3.3 mmol/L — ABNORMAL LOW (ref 3.5–5.1)
Sodium: 137 mmol/L (ref 135–145)
Total Bilirubin: 0.6 mg/dL (ref 0.0–1.2)
Total Protein: 5.9 g/dL — ABNORMAL LOW (ref 6.5–8.1)

## 2024-02-14 LAB — CBC
HCT: 31.5 % — ABNORMAL LOW (ref 36.0–46.0)
Hemoglobin: 10.6 g/dL — ABNORMAL LOW (ref 12.0–15.0)
MCH: 30.7 pg (ref 26.0–34.0)
MCHC: 33.7 g/dL (ref 30.0–36.0)
MCV: 91.3 fL (ref 80.0–100.0)
Platelets: 240 K/uL (ref 150–400)
RBC: 3.45 MIL/uL — ABNORMAL LOW (ref 3.87–5.11)
RDW: 13.2 % (ref 11.5–15.5)
WBC: 19.1 K/uL — ABNORMAL HIGH (ref 4.0–10.5)
nRBC: 0 % (ref 0.0–0.2)

## 2024-02-14 MED ORDER — NIFEDIPINE ER OSMOTIC RELEASE 30 MG PO TB24
30.0000 mg | ORAL_TABLET | Freq: Every day | ORAL | Status: DC
  Administered 2024-02-15: 30 mg via ORAL
  Filled 2024-02-14: qty 1

## 2024-02-14 MED ORDER — SODIUM CHLORIDE 0.9 % IV SOLN: INTRAVENOUS | Status: DC | PRN

## 2024-02-14 NOTE — Progress Notes (Signed)
 Postpartum Day  5, Hospital Day 1  Subjective: 23 y.o. H5E6986 postpartum day #5, hospital day #1, admission for preeclampsia with severe features and postpartum endometritis. She had a normal vaginal delivery on 02/09/2024 without complications. She reports feeling better this morning. Pain is better controlled. She reports an intermittent headache but improved from yesterday. Denies changes in vision or RUQ pain.   Objective: BP 100/83 (BP Location: Right Arm)   Pulse (!) 106   Temp 97.9 F (36.6 C) (Oral)   Resp 16   Ht 5' 2 (1.575 m)   Wt 66.7 kg   LMP 05/09/2023 (Approximate)   SpO2 100%   BMI 26.89 kg/m    Physical Exam:  General: alert, cooperative, and no distress Breasts: soft/nontender Pulm: nl effort Abdomen: soft, non-tender, active bowel sounds Uterine Fundus: firm Lochia: appropriate DVT Evaluation: No evidence of DVT seen on physical exam.     Latest Ref Rng & Units 02/14/2024    8:10 AM 02/13/2024    1:01 PM 02/09/2024    5:08 AM  CBC  WBC 4.0 - 10.5 K/uL 19.1  15.7  11.8   Hemoglobin 12.0 - 15.0 g/dL 89.3  88.3  9.9   Hematocrit 36.0 - 46.0 % 31.5  34.7  28.6   Platelets 150 - 400 K/uL 240  249  188        Latest Ref Rng & Units 02/14/2024    8:10 AM 02/13/2024    1:01 PM 02/08/2024    1:07 PM  CMP  Glucose 70 - 99 mg/dL 897  62  70   BUN 6 - 20 mg/dL 5  6  6    Creatinine 0.44 - 1.00 mg/dL 9.53  9.37  9.43   Sodium 135 - 145 mmol/L 137  140  136   Potassium 3.5 - 5.1 mmol/L 3.3  3.5  3.8   Chloride 98 - 111 mmol/L 103  105  103   CO2 22 - 32 mmol/L 24  24  20    Calcium  8.9 - 10.3 mg/dL 7.0  9.0  8.8   Total Protein 6.5 - 8.1 g/dL 5.9  7.2  6.0   Total Bilirubin 0.0 - 1.2 mg/dL 0.6  1.4  1.3   Alkaline Phos 38 - 126 U/L 125  153  192   AST 15 - 41 U/L 27  46  20   ALT 0 - 44 U/L 45  63  12      Intake/Output Summary (Last 24 hours) at 02/14/2024 1001 Last data filed at 02/14/2024 0908 Gross per 24 hour  Intake 3725.42 ml  Output 3050 ml  Net  675.42 ml     Assessment/Plan: 23 y.o. H5E6986 postpartum day # 5, hospital day #1  Continue routine postpartum care - Routine postpartum medications  - Lactation consult PRN breastfeeding  - Regular diet  - Activity as tolerated  - Bathroom privileges   Preeclampsia with severe features  - Diuresing well - I/O balanced  - Mag sulfate discontinued this morning at 0800, infused for 12 hours  - BP's WNL on Procardia  60 mg  - Continue hypertension protocol  - Labs as follows:   - Platelets: 249->240  - AST/ALT: 46/63 ->27/45  - Urine PCR: 0.85  Postpartum Endometritis  - Uterine tenderness improved, remains afebrile  - Continue IV antibiotics with Clindamycin  and Gentamicin   - Plan to repeat CBC and CMP in AM  - Percocet PRN Q 6 hours - Ibuprofen  PRN Q 6 hours -  Vaginal Cultures pending - Labs as follows:   - WBC 15.7->19.1 - Clinically improving although WBC increasing. Will check CBC in AM.    Disposition: continue inpatient postpartum care    LOS: 1 day   Therisa CHRISTELLA Pillow, CNM 02/14/2024, 9:50 AM   ----- Therisa Pillow  Certified Nurse Midwife [facility]

## 2024-02-14 NOTE — Lactation Note (Signed)
 Lactation Consultation Note  Patient Name: Bassy Fetterly Unijb'd Date: 02/14/2024 Age:23 y.o. Reason for consult: Follow-up assessment   Maternal Data Follow up assessment w/ a patient who has been admitted for s/s of endometritis and elevated Bp 4 days postpartum.  Patient stated that she had not pumped some 3am this morning and her breast feel really full right now.  Lactation Tools Discussed/Used Pump kit already in room, LC provided just the pump since she has been moved to mother/baby floor.  DEBP was set up w/ mom using a size 21mm flange.  LC reviewed the use of the pump with mom and reminded her to calculate about 15 minutes for a pump session or when the drops slow down, varies among women.  Patient verbalized understanding.    Interventions Interventions: Breast massage;DEBP;Education;CDC Guidelines for Breast Pump Cleaning  Informed patient to let RN staff or lactation know if she needs more bottles or labels for her milk.  Instructed mom on how to label milk as well.  Ice packs also provided to patient upon her request.  Discharge Discharge Education: Engorgement and breast care  Consult Status Consult Status: PRN    Julien Oscar S Saloma Cadena 02/14/2024, 12:16 PM

## 2024-02-15 LAB — CBC
HCT: 30.9 % — ABNORMAL LOW (ref 36.0–46.0)
Hemoglobin: 10.3 g/dL — ABNORMAL LOW (ref 12.0–15.0)
MCH: 30.5 pg (ref 26.0–34.0)
MCHC: 33.3 g/dL (ref 30.0–36.0)
MCV: 91.4 fL (ref 80.0–100.0)
Platelets: 244 K/uL (ref 150–400)
RBC: 3.38 MIL/uL — ABNORMAL LOW (ref 3.87–5.11)
RDW: 13.3 % (ref 11.5–15.5)
WBC: 12.5 K/uL — ABNORMAL HIGH (ref 4.0–10.5)
nRBC: 0 % (ref 0.0–0.2)

## 2024-02-15 LAB — COMPREHENSIVE METABOLIC PANEL WITH GFR
ALT: 37 U/L (ref 0–44)
AST: 23 U/L (ref 15–41)
Albumin: 2.9 g/dL — ABNORMAL LOW (ref 3.5–5.0)
Alkaline Phosphatase: 126 U/L (ref 38–126)
Anion gap: 11 (ref 5–15)
BUN: 8 mg/dL (ref 6–20)
CO2: 23 mmol/L (ref 22–32)
Calcium: 8.5 mg/dL — ABNORMAL LOW (ref 8.9–10.3)
Chloride: 104 mmol/L (ref 98–111)
Creatinine, Ser: 0.49 mg/dL (ref 0.44–1.00)
GFR, Estimated: >60 mL/min (ref >60)
Glucose, Bld: 88 mg/dL (ref 70–99)
Potassium: 3.4 mmol/L — ABNORMAL LOW (ref 3.5–5.1)
Sodium: 138 mmol/L (ref 135–145)
Total Bilirubin: 0.6 mg/dL (ref 0.0–1.2)
Total Protein: 6.3 g/dL — ABNORMAL LOW (ref 6.5–8.1)

## 2024-02-15 MED ORDER — NIFEDIPINE ER 30 MG PO TB24: 30.0000 mg | ORAL_TABLET | Freq: Every day | ORAL | 3 refills | Status: AC

## 2024-02-15 NOTE — Progress Notes (Signed)
 Patient discharged home with family. Discharge instructions, when to follow up, and prescriptions reviewed with patient. Patient verbalized understanding. Patient will be escorted out by auxiliary.

## 2024-02-15 NOTE — Lactation Note (Signed)
 Lactation Consultation Note  Patient Name: Colleen Steele Unijb'd Date: 02/15/2024 Age:23 y.o. Reason for consult: Follow-up assessment   Maternal Data Follow up assessment w/ patient.  Mom verbalized that pumping is going good but she does need some bigger bottles and more label.    Feeding Mother's Current Feeding Choice: Breast Milk  Lactation Tools Discussed/Used LC provided mom with some bigger bottles for storing milk and labels.  Overall mom is doing good for the time being.  Interventions Interventions: 8 oz. bottles;Education  Discharge    Consult Status Consult Status: PRN    Paul Trettin S Ayleah Hofmeister 02/15/2024, 10:28 AM

## 2024-02-15 NOTE — Discharge Summary (Signed)
 Gynecological Discharge Summary  Patient Name: Colleen Steele DOB: 08-01-2000 MRN: [id number]  Date of Admission: 02/13/2024 Date of Discharge: 02/15/2024  Hospital course:   The patient was admitted on 02/13/24 with c/o severe abdominal pain, elevated BP and a HA.  02/13/24 1146 started on Procardia  XL 30mg  every day 1314 IVF started 1615 she was started on Gentamicin  q24 hours (48 hours will fall 02/15/24 at 1615) 1633 given 650mg  of Tylenol  and 5mg  of Roxi 1749 she was started on Clindamycin  q8 hours (48 hours will fall 02/15/24 at 1749) 1949 Magnesium  started with a 4g bolus (24 hours will fall 02/14/24 at 1949) 2013 another dose of Procardia  30mg  given 02/14/24  1700 last dose of Roxi given  VS (last 24 hours) Temp: 97.9-98.7  Labs (last 24 hours) WBC 19.1 -> 12.5  Discharged home with precautions after 48 hours of IV antibiotics.  Afebrile for the entire admission.     Discharge Physical Exam:  BP 110/65 (BP Location: Right Arm)   Pulse 95   Temp 98.6 F (37 C) (Oral)   Resp 19   Ht 5' 2 (1.575 m)   Wt 66.7 kg   LMP 05/09/2023 (Approximate)   SpO2 100%   BMI 26.89 kg/m   General: NAD CV: RRR Pulm: nl effort ABD: s/nd/nt DVT Evaluation: LE non-ttp, no evidence of DVT on exam.  Hemoglobin  Date Value Ref Range Status  02/15/2024 10.3 (L) 12.0 - 15.0 g/dL Final  88/72/7980 89.2 (L) 11.1 - 15.9 g/dL Final   HCT  Date Value Ref Range Status  02/15/2024 30.9 (L) 36.0 - 46.0 % Final   Hematocrit  Date Value Ref Range Status  06/20/2018 30.8 (L) 34.0 - 46.6 % Final      Plan:  Colleen Steele was discharged to home in good condition. Follow-up appointment at [facility] in 2 weeks   Discharge Medications: Allergies as of 02/15/2024   No Known Allergies      Medication List     TAKE these medications    acetaminophen  500 MG tablet Commonly known as: TYLENOL  Take 2 tablets (1,000 mg total) by mouth every 6 (six) hours.    albuterol  (2.5 MG/3ML) 0.083% nebulizer solution Commonly known as: PROVENTIL  Inhale 3 mLs into the lungs every 4 (four) hours as needed for wheezing or shortness of breath.   benzocaine -Menthol  20-0.5 % Aero Commonly known as: DERMOPLAST Apply 1 Application topically as needed for irritation (perineal discomfort).   coconut oil Oil Apply 1 Application topically as needed.   dibucaine 1 % Oint Commonly known as: NUPERCAINAL Place 1 Application rectally as needed for hemorrhoids.   ferrous sulfate  325 (65 FE) MG tablet Take 1 tablet (325 mg total) by mouth 2 (two) times daily with a meal.   ibuprofen  600 MG tablet Commonly known as: ADVIL  Take 1 tablet (600 mg total) by mouth every 6 (six) hours.   multivitamin-prenatal 27-0.8 MG Tabs tablet Take 1 tablet by mouth daily at 12 noon.   NIFEdipine  30 MG 24 hr tablet Commonly known as: ADALAT  CC Take 1 tablet (30 mg total) by mouth daily. Start taking on: February 16, 2024   senna-docusate 8.6-50 MG tablet Commonly known as: Senokot-S Take 2 tablets by mouth daily.   simethicone  80 MG chewable tablet Commonly known as: MYLICON Chew 1 tablet (80 mg total) by mouth as needed for flatulence.   witch hazel-glycerin  pad Commonly known as: TUCKS Apply 1 Application topically as needed for hemorrhoids.  Follow-up Information     Vernel Therisa HERO, CNM. Schedule an appointment as soon as possible for a visit on 02/19/2024.   Specialty: Certified Nurse Midwife Contact information: [address] [place] [address] [phone]                 Signed: Margery FORBES Coe, CNM 11:39 AM

## 2024-02-19 LAB — AEROBIC/ANAEROBIC CULTURE W GRAM STAIN (SURGICAL/DEEP WOUND): Gram Stain: NONE SEEN

## 2024-07-05 ENCOUNTER — Observation Stay
Admission: EM | Admit: 2024-07-05 | Discharge: 2024-07-06 | Disposition: A | Attending: Emergency Medicine | Admitting: Emergency Medicine

## 2024-07-05 ENCOUNTER — Encounter: Payer: Self-pay | Admitting: *Deleted

## 2024-07-05 ENCOUNTER — Emergency Department

## 2024-07-05 ENCOUNTER — Other Ambulatory Visit: Payer: Self-pay

## 2024-07-05 DIAGNOSIS — M940 Chondrocostal junction syndrome [Tietze]: Secondary | ICD-10-CM

## 2024-07-05 DIAGNOSIS — I129 Hypertensive chronic kidney disease with stage 1 through stage 4 chronic kidney disease, or unspecified chronic kidney disease: Secondary | ICD-10-CM | POA: Insufficient documentation

## 2024-07-05 DIAGNOSIS — Z79899 Other long term (current) drug therapy: Secondary | ICD-10-CM | POA: Insufficient documentation

## 2024-07-05 DIAGNOSIS — N189 Chronic kidney disease, unspecified: Secondary | ICD-10-CM | POA: Insufficient documentation

## 2024-07-05 DIAGNOSIS — N644 Mastodynia: Secondary | ICD-10-CM

## 2024-07-05 DIAGNOSIS — J45909 Unspecified asthma, uncomplicated: Secondary | ICD-10-CM | POA: Insufficient documentation

## 2024-07-05 LAB — CBC
HCT: 36.1 % (ref 36.0–46.0)
Hemoglobin: 12.1 g/dL (ref 12.0–15.0)
MCH: 31.6 pg (ref 26.0–34.0)
MCHC: 33.5 g/dL (ref 30.0–36.0)
MCV: 94.3 fL (ref 80.0–100.0)
Platelets: 229 K/uL (ref 150–400)
RBC: 3.83 MIL/uL — ABNORMAL LOW (ref 3.87–5.11)
RDW: 13 % (ref 11.5–15.5)
WBC: 11.7 K/uL — ABNORMAL HIGH (ref 4.0–10.5)
nRBC: 0 % (ref 0.0–0.2)

## 2024-07-05 LAB — TROPONIN T, HIGH SENSITIVITY: Troponin T High Sensitivity: <15 ng/L (ref 0–19)

## 2024-07-05 MED ORDER — DOXYCYCLINE HYCLATE 100 MG PO TABS
100.0000 mg | ORAL_TABLET | Freq: Once | ORAL | Status: AC
  Administered 2024-07-05: 100 mg via ORAL
  Filled 2024-07-05: qty 1

## 2024-07-05 MED ORDER — KETOROLAC TROMETHAMINE 30 MG/ML IJ SOLN
30.0000 mg | Freq: Once | INTRAMUSCULAR | Status: AC
  Administered 2024-07-05: 30 mg via INTRAMUSCULAR
  Filled 2024-07-05: qty 1

## 2024-07-05 NOTE — ED Notes (Signed)
 Pt to Xray

## 2024-07-05 NOTE — ED Provider Notes (Signed)
 [facility] Provider Note    Event Date/Time   First MD Initiated Contact with Patient 07/05/24 2058     (approximate)   History   Breast Problem    HPI  Colleen Steele is a 23 y.o. female    with a past medical history of endometriosis, UTI chronic hypertension, who presents to the ED complaining of pain in right breast. According to the patient, yesterday she noticed a l mass on her right breast.  Mass is painful.  Patient reports having chills.  Patient denies fever, nipple drainage.  Patient had a baby 4 months ago.  Stopped breast-feeding him a month ago.  Patient also endorses chest pain.  Patient is here by herself.     Patient Active Problem List   Diagnosis Date Noted   Endometritis following delivery 02/13/2024   Severe pre-eclampsia, postpartum condition or complication 02/13/2024   Delayed delivery after spontaneous rupture of membranes 02/09/2024   Delayed delivery after SROM (spontaneous rupture of membranes) 02/08/2024   Positive GBS test 02/08/2024   Marijuana use during pregnancy 02/08/2024   Late prenatal care 02/08/2024   Limited prenatal care in third trimester 02/08/2024   Elevated glucose tolerance test 02/08/2024   Supervision of high risk pregnancy in third trimester 08/14/2023   Chronic hypertension in pregnancy 10/20/2021     Physical Exam   Triage Vital Signs: ED Triage Vitals  Encounter Vitals Group     BP 07/05/24 1950 (!) 155/95     Girls Systolic BP Percentile --      Girls Diastolic BP Percentile --      Boys Systolic BP Percentile --      Boys Diastolic BP Percentile --      Pulse Rate 07/05/24 1950 91     Resp 07/05/24 1950 16     Temp 07/05/24 1950 98 F (36.7 C)     Temp Source 07/05/24 1950 Oral     SpO2 07/05/24 1950 96 %     Weight --      Height --      Head Circumference --      Peak Flow --      Pain Score 07/05/24 1949 10     Pain Loc --      Pain Education --      Exclude  from Growth Chart --     Most recent vital signs: Vitals:   07/05/24 1950 07/05/24 2331  BP: (!) 155/95 115/81  Pulse: 91 (!) 52  Resp: 16 16  Temp: 98 F (36.7 C) 99.2 F (37.3 C)  SpO2: 96% 100%     Physical Exam Vitals and nursing note reviewed.  During triage patient was hypertensive  General:          Awake, in a mild distress due to breast pain. Anterior chest: Skin is intact, tenderness to palpation in the sternal chondral joint about rib 4 and 5.  Pain increased with deep breath. CV:                  Good peripheral perfusion. Regular rate and rhythm. Resp:               Normal effort. no tachypnea.Equal breath sounds bilaterally.  No wheezing Abd:                 No distention.  Soft nontender Right breast: Skin is intact, no retractions.  There is a mass behind the nipple, mass is fluctuant  about 2 cm diameter.  Tender to palpation.  No discharge from nipple.  No erythema.           ED Results / Procedures / Treatments   Labs (all labs ordered are listed, but only abnormal results are displayed) Labs Reviewed  CBC - Abnormal; Notable for the following components:      Result Value   WBC 11.7 (*)    RBC 3.83 (*)    All other components within normal limits  COMPREHENSIVE METABOLIC PANEL WITH GFR  TROPONIN T, HIGH SENSITIVITY      RADIOLOGY I independently reviewed and interpreted imaging and agree with radiologists findings.      PROCEDURES:  Critical Care performed:   Procedures   MEDICATIONS ORDERED IN ED: Medications  doxycycline (VIBRA-TABS) tablet 100 mg (100 mg Oral Given 07/05/24 2311)  ketorolac  (TORADOL ) 30 MG/ML injection 30 mg (30 mg Intramuscular Given 07/05/24 2327)   Clinical Course as of 07/05/24 2336  Fri Jul 05, 2024  2305 DG Chest 2 View No acute cardiopulmonary process. [AE]  2328 Consulted Dr. Cesar, abscesses behind the nipple.  Dr. Cesar advised to wait for results of CBC CMP to determine the need of incision and  drainage or treatment with only antibiotics. [AE]  2335 CBC(!) White blood cells elevated 11.7. [AE]    Clinical Course User Index [AE] Janit Kast, PA-C    IMPRESSION / MDM / ASSESSMENT AND PLAN / ED COURSE  I reviewed the triage vital signs and the nursing notes.  Differential diagnosis includes, but is not limited to, costochondritis, breast abscess, mastitis, MI  Patient's presentation is most consistent with acute complicated illness / injury requiring diagnostic workup.   Colleen Steele is a 23 y.o., female who presents today with history of 24 hours of breast mass that is painful.  No drainage.  See HPI for further information.  On a physical exam patient was hypertensive during triage.  Patient in mild distress due to pain.  Right breast, skin is intact, evidence of fluctuant mass about 3 cm diameter behind the nipple.  Tender to palpation.  Negative nipple discharge.  Tenderness to palpation at the level of the sternal costal joint about fourth and fifth rib.  Rest of physical exam is normal. Plan Chest x-ray, CBC, CMP, troponin, EKG Ibuprofen  Flexeril  Breast ultrasound Referral to breast center Doxycycline Patient's diagnosis is consistent with right breast abscess, osteochondritis.  Did consult surgeon on-call Dr. Bella who advised to wait for results of CBC and CMP to determine treatment. . Discussed plan of care with patient, answered all of patient's questions, patient agreeable to plan of care. Patient verbalized understanding. Transferred care to Dr. Dorothyann at 11:35 PM  FINAL CLINICAL IMPRESSION(S) / ED DIAGNOSES   Final diagnoses:  Costochondritis, acute  Breast pain     Rx / DC Orders   ED Discharge Orders     None        Note:  This document was prepared using Dragon voice recognition software and may include unintentional dictation errors.   Janit Kast, PA-C 07/05/24 7664    Willo Dunnings, MD 07/05/24 [phone]

## 2024-07-05 NOTE — ED Triage Notes (Signed)
 Pt reports she noticed a knot in the right breast yesterday. Reports pain is throbbing. Denies redness or drainage.

## 2024-07-06 ENCOUNTER — Encounter: Payer: Self-pay | Admitting: General Practice

## 2024-07-06 ENCOUNTER — Encounter
Admission: EM | Disposition: A | Discharge status: Home / Self Care | Payer: Self-pay | Source: Home / Self Care | Attending: Emergency Medicine

## 2024-07-06 ENCOUNTER — Encounter: Payer: Self-pay | Admitting: General Surgery

## 2024-07-06 ENCOUNTER — Other Ambulatory Visit: Payer: Self-pay

## 2024-07-06 DIAGNOSIS — N611 Abscess of the breast and nipple: Principal | ICD-10-CM | POA: Diagnosis present

## 2024-07-06 LAB — COMPREHENSIVE METABOLIC PANEL WITH GFR
ALT: 8 U/L (ref 0–44)
AST: 15 U/L (ref 15–41)
Albumin: 4.3 g/dL (ref 3.5–5.0)
Alkaline Phosphatase: 57 U/L (ref 38–126)
Anion gap: 11 (ref 5–15)
BUN: 12 mg/dL (ref 6–20)
CO2: 21 mmol/L — ABNORMAL LOW (ref 22–32)
Calcium: 8.9 mg/dL (ref 8.9–10.3)
Chloride: 108 mmol/L (ref 98–111)
Creatinine, Ser: 0.72 mg/dL (ref 0.44–1.00)
GFR, Estimated: >60 mL/min (ref >60)
Glucose, Bld: 99 mg/dL (ref 70–99)
Potassium: 3.8 mmol/L (ref 3.5–5.1)
Sodium: 140 mmol/L (ref 135–145)
Total Bilirubin: 0.3 mg/dL (ref 0.0–1.2)
Total Protein: 6.6 g/dL (ref 6.5–8.1)

## 2024-07-06 LAB — HIV ANTIBODY (ROUTINE TESTING W REFLEX): HIV Screen 4th Generation wRfx: NONREACTIVE

## 2024-07-06 SURGERY — INCISION AND DRAINAGE, ABSCESS, BREAST
Anesthesia: Choice | Laterality: Right

## 2024-07-06 MED ORDER — HYDROCODONE-ACETAMINOPHEN 5-325 MG PO TABS: 1.0000 | ORAL_TABLET | ORAL | Status: DC | PRN

## 2024-07-06 MED ORDER — ONDANSETRON 4 MG PO TBDP: 4.0000 mg | ORAL_TABLET | Freq: Four times a day (QID) | ORAL | Status: DC | PRN

## 2024-07-06 MED ORDER — HYDROCODONE-ACETAMINOPHEN 5-325 MG PO TABS
1.0000 | ORAL_TABLET | Freq: Four times a day (QID) | ORAL | 0 refills | Status: AC | PRN
  Filled 2024-07-06: qty 12, 3d supply, fill #0

## 2024-07-06 MED ORDER — PIPERACILLIN-TAZOBACTAM 3.375 G IVPB
3.3750 g | Freq: Three times a day (TID) | INTRAVENOUS | Status: DC
  Administered 2024-07-06: 3.375 g via INTRAVENOUS
  Filled 2024-07-06: qty 50

## 2024-07-06 MED ORDER — ACETAMINOPHEN 650 MG RE SUPP: 650.0000 mg | Freq: Four times a day (QID) | RECTAL | Status: DC | PRN

## 2024-07-06 MED ORDER — MORPHINE SULFATE (PF) 4 MG/ML IV SOLN: 4.0000 mg | INTRAVENOUS | Status: DC | PRN

## 2024-07-06 MED ORDER — NIFEDIPINE ER OSMOTIC RELEASE 30 MG PO TB24
30.0000 mg | ORAL_TABLET | Freq: Every day | ORAL | Status: DC
  Administered 2024-07-06: 30 mg via ORAL
  Filled 2024-07-06: qty 1

## 2024-07-06 MED ORDER — SODIUM CHLORIDE 0.9 % IV SOLN: INTRAVENOUS | Status: DC

## 2024-07-06 MED ORDER — ONDANSETRON HCL 4 MG/2ML IJ SOLN: 4.0000 mg | Freq: Four times a day (QID) | INTRAMUSCULAR | Status: DC | PRN

## 2024-07-06 MED ORDER — AMOXICILLIN-POT CLAVULANATE 875-125 MG PO TABS
1.0000 | ORAL_TABLET | Freq: Two times a day (BID) | ORAL | 0 refills | Status: AC
  Filled 2024-07-06: qty 20, 10d supply, fill #0

## 2024-07-06 MED ORDER — ENOXAPARIN SODIUM 40 MG/0.4ML IJ SOSY: 40.0000 mg | PREFILLED_SYRINGE | INTRAMUSCULAR | Status: DC

## 2024-07-06 MED ORDER — ACETAMINOPHEN 325 MG PO TABS
650.0000 mg | ORAL_TABLET | Freq: Four times a day (QID) | ORAL | Status: DC | PRN
  Administered 2024-07-06: 650 mg via ORAL
  Filled 2024-07-06: qty 2

## 2024-07-06 NOTE — Discharge Summary (Signed)
 Patient ID: Colleen Steele MRN: [id number] DOB/AGE: 2001/02/06 23 y.o.  Admit date: 07/05/2024 Discharge date: 07/06/2024   Discharge Diagnoses:  Principal Problem:   Breast abscess   Procedures: None  Hospital Course: Patient presented to the ER with pain on the right breast.  She was found with a palpable mass.  There was some leukocytosis.  No fever.  Stable vital signs.  She had an ultrasound that shows a complex 2.6 cm fluid collection in the left retroareolar area.  On physical exam this is centralized just behind the nipple areolar complex, no superficial.  Due to the location, not be superficial and no surrounding erythema or sign of severe infection I will follow recommendation by radiologist to start the patient with antibiotic therapy and if no improvement with antibiotic therapy will coordinate with breast radiology for possible aspiration.  I think that this less invasive method will be better than making a big incision to get in the retroareolar area around all the dogs in the retroareolar area with possible complications such as fistulous and complex wound care.  I discussed the recommendation with the patient and she agreed.  Will discharge patient home with Augmentin  and follow-up in my office if no improvement.  Physical Exam Vitals reviewed.  HENT:     Head: Normocephalic.  Cardiovascular:     Rate and Rhythm: Normal rate and regular rhythm.  Pulmonary:     Effort: Pulmonary effort is normal.  Chest:     Comments: Right breast induration in the retroareolar area with palpable mass.  No superficial fluctuance or easy targeted for incision and drainage. Abdominal:     General: Abdomen is flat.  Musculoskeletal:        General: Normal range of motion.     Cervical back: Normal range of motion.  Skin:    General: Skin is warm.     Capillary Refill: Capillary refill takes less than 2 seconds.  Neurological:     General: No focal deficit present.     Mental  Status: She is alert and oriented to person, place, and time.  Psychiatric:        Behavior: Behavior normal.      Consults: None  Disposition: Discharge disposition: 01-Home or Self Care       Discharge Instructions     Diet - low sodium heart healthy   Complete by: As directed    Increase activity slowly   Complete by: As directed       Allergies as of 07/06/2024   No Known Allergies      Medication List     TAKE these medications    acetaminophen  500 MG tablet Commonly known as: TYLENOL  Take 2 tablets (1,000 mg total) by mouth every 6 (six) hours.   albuterol  (2.5 MG/3ML) 0.083% nebulizer solution Commonly known as: PROVENTIL  Inhale 3 mLs into the lungs every 4 (four) hours as needed for wheezing or shortness of breath.   amoxicillin -clavulanate 875-125 MG tablet Commonly known as: AUGMENTIN  Take 1 tablet by mouth 2 (two) times daily for 10 days.   benzocaine -Menthol  20-0.5 % Aero Commonly known as: DERMOPLAST Apply 1 Application topically as needed for irritation (perineal discomfort).   coconut oil Oil Apply 1 Application topically as needed.   dibucaine 1 % Oint Commonly known as: NUPERCAINAL Place 1 Application rectally as needed for hemorrhoids.   ferrous sulfate  325 (65 FE) MG tablet Take 1 tablet (325 mg total) by mouth 2 (two) times daily with a meal.  HYDROcodone -acetaminophen  5-325 MG tablet Commonly known as: NORCO/VICODIN Take 1 tablet by mouth every 6 (six) hours as needed for up to 3 days.   ibuprofen  600 MG tablet Commonly known as: ADVIL  Take 1 tablet (600 mg total) by mouth every 6 (six) hours.   multivitamin-prenatal 27-0.8 MG Tabs tablet Take 1 tablet by mouth daily at 12 noon.   NIFEdipine  30 MG 24 hr tablet Commonly known as: ADALAT  CC Take 1 tablet (30 mg total) by mouth daily.   senna-docusate 8.6-50 MG tablet Commonly known as: Senokot-S Take 2 tablets by mouth daily.   simethicone  80 MG chewable  tablet Commonly known as: MYLICON Chew 1 tablet (80 mg total) by mouth as needed for flatulence.   witch hazel-glycerin  pad Commonly known as: TUCKS Apply 1 Application topically as needed for hemorrhoids.        Follow-up Information     Rodolph Romano, MD Follow up in 1 week(s).   Specialty: General Surgery Why: follow up right breast abscess Contact information: [address] [place] [address] [phone]                 This discharge encounter was for 35-minute most of the time counseling the patient and coordinating plan of care.

## 2024-07-06 NOTE — H&P (Signed)
 SURGICAL HISTORY AND PHYSICAL NOTE   HISTORY OF PRESENT ILLNESS (HPI):  23 y.o. female presented to [facility] ED for evaluation of right breast abscess pain. Patient reports she has been having pain on her right breast for few days.  Pain localized to the right breast at the nipple areolar complex area.  No pain radiation.  Pain aggravated by applying pressure.  No alleviating factors.  She endorses that she feels a knot that has been growing quickly in the last few days.  There has been no drainage.  There is no nipple discharge.  Denies any fever.  Patient endorses history of breast-feeding.  Last time she breast-fed was about 2 months ago.  At the ER she was found with mild leukocytosis but no fever.  On physical exam there is a palpable retroareolar mass tender to palpation.  No fluctuance.  No nipple discharge.  She had an ultrasound that showed a 2.6 cm complex fluid collection in the retroareolar area.  Surgery is consulted by Dr. Dorothyann in this context for evaluation and management of abscess of breast.  PAST MEDICAL HISTORY (PMH):  Past Medical History:  Diagnosis Date   Asthma    Chlamydia    Chronic kidney disease    Diarrhea    Environmental allergies    Gonorrhea    Hypertension    Trichomonal infection 04/20/2018   Treated with Flagyl  x 2gm; negative TOC on 05/22/18     Vomiting      PAST SURGICAL HISTORY (PSH):  Past Surgical History:  Procedure Laterality Date   MOUTH SURGERY     NO PAST SURGERIES       MEDICATIONS:  Prior to Admission medications  Medication Sig Start Date End Date Taking? Authorizing Provider  acetaminophen  (TYLENOL ) 500 MG tablet Take 2 tablets (1,000 mg total) by mouth every 6 (six) hours. 02/10/24   Dickerson, Felicia, CNM  albuterol  (PROVENTIL ) (2.5 MG/3ML) 0.083% nebulizer solution Inhale 3 mLs into the lungs every 4 (four) hours as needed for wheezing or shortness of breath. 02/10/24   Dickerson, Felicia, CNM  benzocaine -Menthol   (DERMOPLAST) 20-0.5 % AERO Apply 1 Application topically as needed for irritation (perineal discomfort). 02/10/24   Dickerson, Felicia, CNM  coconut oil OIL Apply 1 Application topically as needed. 02/10/24   Dickerson, Felicia, CNM  dibucaine (NUPERCAINAL) 1 % OINT Place 1 Application rectally as needed for hemorrhoids. 02/10/24   Aisha Heller, CNM  ferrous sulfate  325 (65 FE) MG tablet Take 1 tablet (325 mg total) by mouth 2 (two) times daily with a meal. 02/10/24   Aisha Heller, CNM  ibuprofen  (ADVIL ) 600 MG tablet Take 1 tablet (600 mg total) by mouth every 6 (six) hours. 02/10/24   Dickerson, Felicia, CNM  NIFEdipine  (ADALAT  CC) 30 MG 24 hr tablet Take 1 tablet (30 mg total) by mouth daily. 02/16/24 02/15/25  Myron Nest, CNM  Prenatal Vit-Fe Fumarate-FA (MULTIVITAMIN-PRENATAL) 27-0.8 MG TABS tablet Take 1 tablet by mouth daily at 12 noon. Patient not taking: Reported on 02/08/2024    [provider]  senna-docusate (SENOKOT-S) 8.6-50 MG tablet Take 2 tablets by mouth daily. 02/11/24   Dickerson, Felicia, CNM  simethicone  (MYLICON) 80 MG chewable tablet Chew 1 tablet (80 mg total) by mouth as needed for flatulence. 02/10/24   Aisha Heller, CNM  witch hazel-glycerin  (TUCKS) pad Apply 1 Application topically as needed for hemorrhoids. 02/10/24   Aisha Heller, CNM     ALLERGIES:  Allergies[1]   SOCIAL HISTORY:  Social History  Socioeconomic History   Marital status: Single    Spouse name: Not on file   Number of children: Not on file   Years of education: 12   Highest education level: 12th grade  Occupational History   Occupation: Teacher, Early Years/pre: [employer]  Tobacco Use   Smoking status: Never   Smokeless tobacco: Never  Vaping Use   Vaping status: Never Used  Substance and Sexual Activity   Alcohol use: Not Currently    Comment: occ   Drug use: Yes    Types: Marijuana    Comment: pt states she has not used for a couple of weeks    Sexual activity: Not Currently    Birth control/protection: None  Other Topics Concern   Not on file  Social History Narrative   7th grade   Social Drivers of Health   Tobacco Use: Low Risk (07/06/2024)   Patient History    Smoking Tobacco Use: Never    Smokeless Tobacco Use: Never    Passive Exposure: Not on file  Financial Resource Strain: Low Risk  (10/16/2023)   Received from [facility]   Overall Financial Resource Strain (CARDIA)    Difficulty of Paying Living Expenses: Not very hard  Food Insecurity: No Food Insecurity (07/06/2024)   Epic    Worried About Running Out of Food in the Last Year: Never true    Ran Out of Food in the Last Year: Never true  Transportation Needs: No Transportation Needs (07/06/2024)   Epic    Lack of Transportation (Medical): No    Lack of Transportation (Non-Medical): No  Physical Activity: Not on file  Stress: Not on file  Social Connections: Moderately Isolated (07/06/2024)   Social Connection and Isolation Panel    Frequency of Communication with Friends and Family: More than three times a week    Frequency of Social Gatherings with Friends and Family: Three times a week    Attends Religious Services: 1 to 4 times per year    Active Member of Clubs or Organizations: No    Attends Banker Meetings: Never    Marital Status: Never married  Intimate Partner Violence: Not At Risk (07/06/2024)   Epic    Fear of Current or Ex-Partner: No    Emotionally Abused: No    Physically Abused: No    Sexually Abused: No  Depression (PHQ2-9): Not on file  Alcohol Screen: Not on file  Housing: High Risk (07/06/2024)   Epic    Unable to Pay for Housing in the Last Year: Yes    Number of Times Moved in the Last Year: 0    Homeless in the Last Year: No  Utilities: Not At Risk (07/06/2024)   Epic    Threatened with loss of utilities: No  Health Literacy: Not on file      FAMILY HISTORY:  Family History  Problem  Relation Age of Onset   Diverticulitis Sister    Hypertension Mother      REVIEW OF SYSTEMS:  Constitutional: denies weight loss, fever, chills, or sweats  Eyes: denies any other vision changes, history of eye injury  ENT: denies sore throat, hearing problems  Respiratory: denies shortness of breath, wheezing  Cardiovascular: denies chest pain, palpitations  Gastrointestinal: Denies abdominal pain, nausea and vomiting Genitourinary: denies burning with urination or urinary frequency Musculoskeletal: denies any other joint pains or cramps  Skin: denies any other rashes or skin discolorations  Neurological: denies any  other headache, dizziness, weakness  Psychiatric: denies any other depression, anxiety   All other review of systems were negative   VITAL SIGNS:  Temp:  [97.5 F (36.4 C)-99.2 F (37.3 C)] 97.5 F (36.4 C) (12/13 0728) Pulse Rate:  [52-91] 72 (12/13 0728) Resp:  [16-17] 17 (12/13 0728) BP: (106-155)/(61-95) 127/79 (12/13 0728) SpO2:  [96 %-100 %] 99 % (12/13 0728) Weight:  [68.9 kg] 68.9 kg (12/13 0740)     Height: 5' 2.01 (157.5 cm) Weight: 68.9 kg BMI (Calculated): 27.79   INTAKE/OUTPUT:  This shift: No intake/output data recorded.  Last 2 shifts: @IOLAST2SHIFTS @   PHYSICAL EXAM:  Constitutional:  -- Normal body habitus  -- Awake, alert, and oriented x3  Eyes:  -- Pupils equally round and reactive to light  -- No scleral icterus  Ear, nose, and throat:  -- No jugular venous distension  Pulmonary:  -- No crackles  -- Equal breath sounds bilaterally -- Breathing non-labored at rest Cardiovascular:  -- S1, S2 present  -- No pericardial rubs Breast:  -- Right breast palpable mass at the retroareolar area centralized.  No superficial collection, no fluctuance.  No erythema.  No nipple discharge.  No axillary adenopathy. Gastrointestinal:  -- Abdomen soft, nontender, non-distended, no guarding or rebound tenderness Musculoskeletal and Integumentary:   -- Wounds: None appreciated -- Extremities: B/L UE and LE FROM, hands and feet warm, no edema  Neurologic:  -- Motor function: intact and symmetric -- Sensation: intact and symmetric   Labs:     Latest Ref Rng & Units 07/05/2024   11:10 PM 02/15/2024    4:57 AM 02/14/2024    8:10 AM  CBC  WBC 4.0 - 10.5 K/uL 11.7  12.5  19.1   Hemoglobin 12.0 - 15.0 g/dL 87.8  89.6  89.3   Hematocrit 36.0 - 46.0 % 36.1  30.9  31.5   Platelets 150 - 400 K/uL 229  244  240       Latest Ref Rng & Units 07/05/2024   11:10 PM 02/15/2024    4:57 AM 02/14/2024    8:10 AM  CMP  Glucose 70 - 99 mg/dL 99  88  897   BUN 6 - 20 mg/dL 12  8  <5   Creatinine 0.44 - 1.00 mg/dL 9.27  9.50  9.53   Sodium 135 - 145 mmol/L 140  138  137   Potassium 3.5 - 5.1 mmol/L 3.8  3.4  3.3   Chloride 98 - 111 mmol/L 108  104  103   CO2 22 - 32 mmol/L 21  23  24    Calcium  8.9 - 10.3 mg/dL 8.9  8.5  7.0   Total Protein 6.5 - 8.1 g/dL 6.6  6.3  5.9   Total Bilirubin 0.0 - 1.2 mg/dL 0.3  0.6  0.6   Alkaline Phos 38 - 126 U/L 57  126  125   AST 15 - 41 U/L 15  23  27    ALT 0 - 44 U/L 8  37  45     Imaging studies:  CLINICAL DATA:  Recently stopped breast feeding, concern for abscess   EXAM: ULTRASOUND OF THE right BREAST   COMPARISON:  None   FINDINGS: Targeted sonography of the region of clinical concern is performed.   At the 4 o'clock retroareolar position there is echogenic edema within the soft tissues. Complex fluid collection measuring 2.6 by 1 by 1 cm. Peripheral hyperemia is noted.   IMPRESSION: 1. 2.6 cm  complex fluid collection at the 4 o'clock retroareolar position of the right breast with peripheral hyperemia, suspicious for abscess.   RECOMMENDATION: 1. If a breast abscess, or possible abscess, is demonstrated by ultrasound in the absence of systemic illness, non-intact overlying skin, or other clinical features requiring hospital admission, it is recommended that patient be started on an  appropriate course of antibiotics and referred to the [facility] the following morning for possible abscess drainage.   2. If patient does not have a primary care physician and is to be referred to the [facility], patient is to be first referred the following morning to the [facility] who has agreed to aid in the outpatient management ([address]) (phone# [phone]).   Recommended antibiotics for outpatient treatment of breast abscess:   No MRSA risk   1. Keflex  500 mg po qid 2. Clindamycin  300 mg po tid   MRSA risk   1. Bactrim 2 tabs po bid 2. Doxycycline  100 mg po bid   Subareolar abscess, recurrent abscess, nipple retraction   1. Augmentin  875 mg po bid 2. Clindamycin  300 mg po tid   BI-RADS CATEGORY  3: Probably benign.     Electronically Signed   By: Luke Bun M.D.   On: 07/05/2024 23:23  Assessment/Plan:  23 y.o. female with mastitis with abscess of right breast.  I personally evaluated the breast ultrasound showing the 2.6 Demeter complex mass.  Due to leukocytosis and pain will admit patient for IV antibiotic and consideration of incision and drainage Keep n.p.o. for possible incision and drainage  I personally spent a total of 60 minutes in the care of the patient today including preparing to see the patient, getting/reviewing separately obtained history, performing a medically appropriate exam/evaluation, counseling and educating, placing orders, referring and communicating with other health care professionals, documenting clinical information in the EHR, independently interpreting results, communicating results, and coordinating care.   Lucas Petrin, MD     [1] No Known Allergies

## 2024-07-06 NOTE — Progress Notes (Signed)
 Pt alert and oriented x4. Educated on lovenox  inj medication prior to discharge. Pt refused.  Medication not given. Patient independent at level 3 with no restrictions or risk.

## 2024-07-06 NOTE — Discharge Instructions (Signed)
°  Diet: Resume home heart healthy regular diet.   Activity: No activity restrictions  Medications: Take antibiotic therapy as prescribed.  Contact my office early in the week if no significant improvement of pain or size of the right breast retroareolar mass to coordinate appointment with breast radiologist for possible aspiration.  Call office [phone] at any time if any questions, worsening pain, fevers/chills, bleeding, drainage from incision site, or other concerns.
